# Patient Record
Sex: Female | Born: 1955 | Race: White | Hispanic: No | Marital: Single | Smoking: Former smoker
Health system: Southern US, Community
[De-identification: ages and names within clinical notes are randomized; demographics above are authoritative.]

## PROBLEM LIST (undated history)

## (undated) DIAGNOSIS — F0390 Unspecified dementia without behavioral disturbance: Secondary | ICD-10-CM

## (undated) DIAGNOSIS — F32A Depression, unspecified: Secondary | ICD-10-CM

## (undated) DIAGNOSIS — F209 Schizophrenia, unspecified: Secondary | ICD-10-CM

## (undated) HISTORY — PX: APPENDECTOMY: SHX54

## (undated) HISTORY — PX: TOTAL HIP ARTHROPLASTY: SHX124

## (undated) HISTORY — PX: HERNIA REPAIR: SHX51

## (undated) HISTORY — PX: TUBAL LIGATION: SHX77

## (undated) HISTORY — PX: SPLENECTOMY: SUR1306

---

## 2021-10-26 ENCOUNTER — Observation Stay: Payer: Medicare Other

## 2021-10-26 ENCOUNTER — Other Ambulatory Visit: Payer: Self-pay

## 2021-10-26 ENCOUNTER — Emergency Department: Payer: Medicare Other

## 2021-10-26 ENCOUNTER — Inpatient Hospital Stay
Admission: EM | Admit: 2021-10-26 | Discharge: 2021-11-01 | DRG: 689 | Disposition: A | Discharge status: Home Health | Payer: Medicare Other | Source: Skilled Nursing Facility | Attending: Internal Medicine | Admitting: Internal Medicine

## 2021-10-26 ENCOUNTER — Encounter: Payer: Self-pay | Admitting: Internal Medicine

## 2021-10-26 DIAGNOSIS — Z7901 Long term (current) use of anticoagulants: Secondary | ICD-10-CM

## 2021-10-26 DIAGNOSIS — F32A Depression, unspecified: Secondary | ICD-10-CM | POA: Diagnosis present

## 2021-10-26 DIAGNOSIS — E785 Hyperlipidemia, unspecified: Secondary | ICD-10-CM | POA: Diagnosis present

## 2021-10-26 DIAGNOSIS — Z79899 Other long term (current) drug therapy: Secondary | ICD-10-CM

## 2021-10-26 DIAGNOSIS — Z20822 Contact with and (suspected) exposure to covid-19: Secondary | ICD-10-CM | POA: Diagnosis present

## 2021-10-26 DIAGNOSIS — Z86718 Personal history of other venous thrombosis and embolism: Secondary | ICD-10-CM

## 2021-10-26 DIAGNOSIS — A040 Enteropathogenic Escherichia coli infection: Secondary | ICD-10-CM | POA: Diagnosis present

## 2021-10-26 DIAGNOSIS — R4182 Altered mental status, unspecified: Secondary | ICD-10-CM | POA: Diagnosis not present

## 2021-10-26 DIAGNOSIS — F0390 Unspecified dementia without behavioral disturbance: Secondary | ICD-10-CM | POA: Diagnosis present

## 2021-10-26 DIAGNOSIS — F0393 Unspecified dementia, unspecified severity, with mood disturbance: Secondary | ICD-10-CM | POA: Diagnosis present

## 2021-10-26 DIAGNOSIS — R600 Localized edema: Secondary | ICD-10-CM

## 2021-10-26 DIAGNOSIS — K219 Gastro-esophageal reflux disease without esophagitis: Secondary | ICD-10-CM | POA: Diagnosis present

## 2021-10-26 DIAGNOSIS — G9341 Metabolic encephalopathy: Secondary | ICD-10-CM | POA: Diagnosis present

## 2021-10-26 DIAGNOSIS — G2581 Restless legs syndrome: Secondary | ICD-10-CM | POA: Diagnosis present

## 2021-10-26 DIAGNOSIS — N3000 Acute cystitis without hematuria: Secondary | ICD-10-CM

## 2021-10-26 DIAGNOSIS — R531 Weakness: Secondary | ICD-10-CM | POA: Diagnosis present

## 2021-10-26 DIAGNOSIS — E782 Mixed hyperlipidemia: Secondary | ICD-10-CM

## 2021-10-26 DIAGNOSIS — N390 Urinary tract infection, site not specified: Secondary | ICD-10-CM | POA: Diagnosis not present

## 2021-10-26 DIAGNOSIS — F209 Schizophrenia, unspecified: Secondary | ICD-10-CM | POA: Diagnosis present

## 2021-10-26 DIAGNOSIS — Z9181 History of falling: Secondary | ICD-10-CM

## 2021-10-26 DIAGNOSIS — R8281 Pyuria: Secondary | ICD-10-CM | POA: Diagnosis present

## 2021-10-26 DIAGNOSIS — K224 Dyskinesia of esophagus: Secondary | ICD-10-CM | POA: Diagnosis present

## 2021-10-26 DIAGNOSIS — B951 Streptococcus, group B, as the cause of diseases classified elsewhere: Secondary | ICD-10-CM | POA: Diagnosis present

## 2021-10-26 DIAGNOSIS — R110 Nausea: Secondary | ICD-10-CM

## 2021-10-26 HISTORY — DX: Depression, unspecified: F32.A

## 2021-10-26 HISTORY — DX: Schizophrenia, unspecified: F20.9

## 2021-10-26 HISTORY — DX: Unspecified dementia, unspecified severity, without behavioral disturbance, psychotic disturbance, mood disturbance, and anxiety: F03.90

## 2021-10-26 LAB — URINE DRUG SCREEN, QUALITATIVE (ARMC ONLY)
Amphetamines, Ur Screen: NOT DETECTED
Barbiturates, Ur Screen: NOT DETECTED
Benzodiazepine, Ur Scrn: NOT DETECTED
Cannabinoid 50 Ng, Ur ~~LOC~~: NOT DETECTED
Cocaine Metabolite,Ur ~~LOC~~: NOT DETECTED
MDMA (Ecstasy)Ur Screen: NOT DETECTED
Methadone Scn, Ur: NOT DETECTED
Opiate, Ur Screen: NOT DETECTED
Phencyclidine (PCP) Ur S: POSITIVE — AB
Tricyclic, Ur Screen: POSITIVE — AB

## 2021-10-26 LAB — COMPREHENSIVE METABOLIC PANEL
ALT: 15 U/L (ref 0–44)
AST: 24 U/L (ref 15–41)
Albumin: 3.4 g/dL — ABNORMAL LOW (ref 3.5–5.0)
Alkaline Phosphatase: 90 U/L (ref 38–126)
Anion gap: 10 (ref 5–15)
BUN: 20 mg/dL (ref 8–23)
CO2: 24 mmol/L (ref 22–32)
Calcium: 10 mg/dL (ref 8.9–10.3)
Chloride: 108 mmol/L (ref 98–111)
Creatinine, Ser: 1.64 mg/dL — ABNORMAL HIGH (ref 0.44–1.00)
GFR, Estimated: 35 mL/min — ABNORMAL LOW (ref >60)
Glucose, Bld: 99 mg/dL (ref 70–99)
Potassium: 3.5 mmol/L (ref 3.5–5.1)
Sodium: 142 mmol/L (ref 135–145)
Total Bilirubin: 0.5 mg/dL (ref 0.3–1.2)
Total Protein: 6.7 g/dL (ref 6.5–8.1)

## 2021-10-26 LAB — CBC WITH DIFFERENTIAL/PLATELET
Abs Immature Granulocytes: 0.03 10*3/uL (ref 0.00–0.07)
Basophils Absolute: 0.1 10*3/uL (ref 0.0–0.1)
Basophils Relative: 1 %
Eosinophils Absolute: 0.1 10*3/uL (ref 0.0–0.5)
Eosinophils Relative: 1 %
HCT: 41.5 % (ref 36.0–46.0)
Hemoglobin: 12.6 g/dL (ref 12.0–15.0)
Immature Granulocytes: 0 %
Lymphocytes Relative: 30 %
Lymphs Abs: 3.5 10*3/uL (ref 0.7–4.0)
MCH: 31.3 pg (ref 26.0–34.0)
MCHC: 30.4 g/dL (ref 30.0–36.0)
MCV: 103 fL — ABNORMAL HIGH (ref 80.0–100.0)
Monocytes Absolute: 1.7 10*3/uL — ABNORMAL HIGH (ref 0.1–1.0)
Monocytes Relative: 15 %
Neutro Abs: 6.1 10*3/uL (ref 1.7–7.7)
Neutrophils Relative %: 53 %
Platelets: 391 10*3/uL (ref 150–400)
RBC: 4.03 MIL/uL (ref 3.87–5.11)
RDW: 13.6 % (ref 11.5–15.5)
WBC: 11.4 10*3/uL — ABNORMAL HIGH (ref 4.0–10.5)
nRBC: 0 % (ref 0.0–0.2)

## 2021-10-26 LAB — URINALYSIS, ROUTINE W REFLEX MICROSCOPIC
Bilirubin Urine: NEGATIVE
Glucose, UA: NEGATIVE mg/dL
Ketones, ur: NEGATIVE mg/dL
Nitrite: NEGATIVE
Protein, ur: 100 mg/dL — AB
Specific Gravity, Urine: 1.017 (ref 1.005–1.030)
WBC, UA: >50 WBC/hpf — ABNORMAL HIGH (ref 0–5)
pH: 5 (ref 5.0–8.0)

## 2021-10-26 LAB — TROPONIN I (HIGH SENSITIVITY)
Troponin I (High Sensitivity): 6 ng/L (ref <18)
Troponin I (High Sensitivity): 5 ng/L (ref <18)

## 2021-10-26 LAB — BLOOD GAS, VENOUS
Acid-base deficit: 1 mmol/L (ref 0.0–2.0)
Bicarbonate: 27.1 mmol/L (ref 20.0–28.0)
O2 Saturation: 25.6 %
Patient temperature: 37
pCO2, Ven: 59 mmHg (ref 44–60)
pH, Ven: 7.27 (ref 7.25–7.43)
pO2, Ven: <31 mmHg — CL (ref 32–45)

## 2021-10-26 LAB — SARS CORONAVIRUS 2 BY RT PCR: SARS Coronavirus 2 by RT PCR: NEGATIVE

## 2021-10-26 LAB — HIV ANTIBODY (ROUTINE TESTING W REFLEX): HIV Screen 4th Generation wRfx: NONREACTIVE

## 2021-10-26 LAB — ETHANOL: Alcohol, Ethyl (B): <10 mg/dL (ref <10)

## 2021-10-26 LAB — AMMONIA: Ammonia: 23 umol/L (ref 9–35)

## 2021-10-26 LAB — LACTIC ACID, PLASMA: Lactic Acid, Venous: 1.7 mmol/L (ref 0.5–1.9)

## 2021-10-26 LAB — CK: Total CK: 120 U/L (ref 38–234)

## 2021-10-26 MED ORDER — APIXABAN 5 MG PO TABS
5.0000 mg | ORAL_TABLET | Freq: Two times a day (BID) | ORAL | Status: DC
  Administered 2021-10-26 – 2021-10-29 (×6): 5 mg via ORAL
  Filled 2021-10-26 (×6): qty 1

## 2021-10-26 MED ORDER — QUETIAPINE FUMARATE 25 MG PO TABS
75.0000 mg | ORAL_TABLET | Freq: Every day | ORAL | Status: DC
  Administered 2021-10-26 – 2021-10-31 (×6): 75 mg via ORAL
  Filled 2021-10-26 (×6): qty 3

## 2021-10-26 MED ORDER — ACETAMINOPHEN 500 MG PO TABS: 1000.0000 mg | ORAL_TABLET | Freq: Four times a day (QID) | ORAL | Status: AC | PRN

## 2021-10-26 MED ORDER — VENLAFAXINE HCL 37.5 MG PO TABS
75.0000 mg | ORAL_TABLET | Freq: Two times a day (BID) | ORAL | Status: DC
  Administered 2021-10-26 – 2021-10-31 (×11): 75 mg via ORAL
  Filled 2021-10-26 (×12): qty 2

## 2021-10-26 MED ORDER — MELATONIN 5 MG PO TABS
2.5000 mg | ORAL_TABLET | Freq: Every day | ORAL | Status: DC
  Administered 2021-10-27 – 2021-10-31 (×5): 2.5 mg via ORAL
  Filled 2021-10-26 (×5): qty 1

## 2021-10-26 MED ORDER — VITAMIN B-12 1000 MCG PO TABS
1000.0000 ug | ORAL_TABLET | Freq: Every day | ORAL | Status: DC
  Administered 2021-10-27 – 2021-10-31 (×5): 1000 ug via ORAL
  Filled 2021-10-26 (×5): qty 1

## 2021-10-26 MED ORDER — BENZTROPINE MESYLATE 1 MG PO TABS
0.5000 mg | ORAL_TABLET | Freq: Two times a day (BID) | ORAL | Status: DC
  Administered 2021-10-26 – 2021-10-31 (×11): 0.5 mg via ORAL
  Filled 2021-10-26 (×11): qty 1

## 2021-10-26 MED ORDER — PANTOPRAZOLE SODIUM 40 MG PO TBEC
40.0000 mg | DELAYED_RELEASE_TABLET | Freq: Two times a day (BID) | ORAL | Status: DC
  Administered 2021-10-26 – 2021-10-31 (×11): 40 mg via ORAL
  Filled 2021-10-26 (×11): qty 1

## 2021-10-26 MED ORDER — ENOXAPARIN SODIUM 40 MG/0.4ML IJ SOSY: 40.0000 mg | PREFILLED_SYRINGE | INTRAMUSCULAR | Status: DC

## 2021-10-26 MED ORDER — MIRTAZAPINE 15 MG PO TABS
30.0000 mg | ORAL_TABLET | Freq: Every day | ORAL | Status: DC
  Administered 2021-10-26 – 2021-10-31 (×6): 30 mg via ORAL
  Filled 2021-10-26 (×6): qty 2

## 2021-10-26 MED ORDER — SODIUM CHLORIDE 0.9 % IV SOLN
1.0000 g | Freq: Once | INTRAVENOUS | Status: AC
  Administered 2021-10-26: 1 g via INTRAVENOUS
  Filled 2021-10-26: qty 10

## 2021-10-26 MED ORDER — ACETAMINOPHEN 650 MG RE SUPP: 650.0000 mg | Freq: Four times a day (QID) | RECTAL | Status: AC | PRN

## 2021-10-26 MED ORDER — ZOLPIDEM TARTRATE 5 MG PO TABS
5.0000 mg | ORAL_TABLET | Freq: Every evening | ORAL | Status: DC | PRN
  Administered 2021-10-31: 5 mg via ORAL
  Filled 2021-10-26: qty 1

## 2021-10-26 MED ORDER — ONDANSETRON HCL 4 MG/2ML IJ SOLN
4.0000 mg | Freq: Four times a day (QID) | INTRAMUSCULAR | Status: AC | PRN
  Administered 2021-10-27 – 2021-10-29 (×3): 4 mg via INTRAVENOUS
  Filled 2021-10-26 (×3): qty 2

## 2021-10-26 MED ORDER — ROPINIROLE HCL 1 MG PO TABS
0.5000 mg | ORAL_TABLET | Freq: Every day | ORAL | Status: DC
  Administered 2021-10-26 – 2021-10-31 (×6): 0.5 mg via ORAL
  Filled 2021-10-26 (×6): qty 1

## 2021-10-26 MED ORDER — ROSUVASTATIN CALCIUM 10 MG PO TABS
10.0000 mg | ORAL_TABLET | Freq: Every day | ORAL | Status: DC
Start: 2021-10-26 — End: 2021-11-01
  Administered 2021-10-26 – 2021-10-31 (×6): 10 mg via ORAL
  Filled 2021-10-26 (×6): qty 1

## 2021-10-26 MED ORDER — SODIUM CHLORIDE 0.9 % IV SOLN
1.0000 g | INTRAVENOUS | Status: DC
  Administered 2021-10-27 – 2021-10-28 (×2): 1 g via INTRAVENOUS
  Filled 2021-10-26: qty 1
  Filled 2021-10-26: qty 10
  Filled 2021-10-26: qty 1

## 2021-10-26 MED ORDER — ONDANSETRON HCL 4 MG PO TABS
4.0000 mg | ORAL_TABLET | Freq: Four times a day (QID) | ORAL | Status: AC | PRN
  Administered 2021-10-28 – 2021-10-29 (×2): 4 mg via ORAL
  Filled 2021-10-26 (×2): qty 1

## 2021-10-26 NOTE — ED Provider Notes (Signed)
[facility] Provider Note    Event Date/Time   First MD Initiated Contact with Patient 10/26/21 1126     (approximate)   History   Altered Mental Status (Patient BIB for AMS; Patient somnolent but oriented, falls asleep during triage; Denies pain; CBG 127; Denies any falls or head injuries)   HPI  Kristin Watts is a 66 y.o. female  with dementia, schizophrenia, dvt on eliquis who comes in for AMS from assisted living. Unclear what is her baseline. Unclear her last normal.  Patient has reported some falls to me.  Aox2.  Unclear if there is any new medication changes  12:13 PM  Talked to nursing staff noted to not be normal since Saturday night with hallucinations and see people in her room-Seems different then her normal. Sunday seemed a bit better but then again had more hallucinations at night. Last night she didn't want to get into bed due to people in her bed. She has had increased urination as well. Strong odor was noted as well. She started having this morning the drifting off and falling asleep and wanted her to be evaluate. Unknown if any medications changes. Last fall was a few weeks ago. Normally ambulatory.    Physical Exam   Triage Vital Signs: Blood pressure (!) 145/79, pulse 71, temperature 98 F (36.7 C), temperature source Oral, resp. rate 19, weight 78.1 kg, SpO2 98 %.   Most recent vital signs: Vitals:   10/26/21 1133  BP: (!) 145/79  Pulse: 71  Resp: 19  Temp: 98 F (36.7 C)  SpO2: 98%     General: Awake, no distress.  CV:  Good peripheral perfusion.  Resp:  Normal effort.  Abd:  No distention.  Other:  Patient able to lift both legs up off the bed.  Able to do grip strength bilaterally.  Patient is frequently falling asleep but pupils are reactive she is oriented x2 just continues to drift off to sleep   ED Results / Procedures / Treatments   Labs (all labs ordered are listed, but only abnormal results are displayed) Labs  Reviewed  SARS CORONAVIRUS 2 BY RT PCR  COMPREHENSIVE METABOLIC PANEL  CBC WITH DIFFERENTIAL/PLATELET  LACTIC ACID, PLASMA  AMMONIA  BLOOD GAS, VENOUS  CK  URINALYSIS, ROUTINE W REFLEX MICROSCOPIC  URINE DRUG SCREEN, QUALITATIVE ([facility] ONLY)  ETHANOL  TROPONIN I (HIGH SENSITIVITY)     EKG  My interpretation of EKG:  Sinus rate of 71 without any ST elevation or T wave inversions, normal intervals except for type I AV block  RADIOLOGY I have reviewed the CT head personally and interpreted I do not see evidence of intracranial hemorrhage   PROCEDURES:  Critical Care performed: No  .1-3 Lead EKG Interpretation  Performed by: Concha Se, MD Authorized by: Concha Se, MD     Interpretation: normal     ECG rate:  71   ECG rate assessment: normal     Rhythm: sinus rhythm     Ectopy: none     Conduction: normal      MEDICATIONS ORDERED IN ED: Medications  cefTRIAXone (ROCEPHIN) 1 g in sodium chloride 0.9 % 100 mL IVPB (has no administration in time range)     IMPRESSION / MDM / ASSESSMENT AND PLAN / ED COURSE  I reviewed the triage vital signs and the nursing notes.   Patient's presentation is most consistent with acute presentation with potential threat to life or bodily function.  Differential includes hypercapnia, to cranial hemorrhage, cervical fracture, medications, UTI  CBC slightly elevated white count but patient does not meet sepsis criteria so blood cultures and lactate were not ordered.  Creatinine was elevated at 1.6 unclear baseline.  Notes of ammonia elevation.  Patient does have what is concerning for UTI.  CO2 normal  On repeat evaluation patient is still pretty sleepy at bedside however she is able to wake up denies any back pain or abdominal pain.  I rechecked her temperature and she remains afebrile.  Given patient is not the best historian I will add on a CT renal just to make sure there is no infected kidney stones that would require  procedure.  Given patient's altered mental status with UTI and hallucinations will discuss with hospital team for admission  The patient is on the cardiac monitor to evaluate for evidence of arrhythmia and/or significant heart rate changes.      FINAL CLINICAL IMPRESSION(S) / ED DIAGNOSES   Final diagnoses:  Altered mental status, unspecified altered mental status type  Acute cystitis without hematuria     Rx / DC Orders   ED Discharge Orders     None        Note:  This document was prepared using Dragon voice recognition software and may include unintentional dictation errors.   Concha Se, MD 10/26/21 [phone]

## 2021-10-26 NOTE — Assessment & Plan Note (Signed)
-   Ropinirole 0.5 mg nightly resumed 

## 2021-10-26 NOTE — Assessment & Plan Note (Signed)
Continue Crestor and Zetia 

## 2021-10-26 NOTE — Assessment & Plan Note (Addendum)
-   Presumptive diagnosis is secondary to UTI - Continue ceftriaxone 1 g daily, 4 more additional doses ordered to complete 5 day course - Urine culture is in process - Admit to telemetry medical, observation

## 2021-10-26 NOTE — Assessment & Plan Note (Addendum)
-   Benztropine 0.5 mg p.o. twice daily

## 2021-10-26 NOTE — Assessment & Plan Note (Signed)
-  Continue home Tikosyn -Holding Eliquis as patient is currently on heparin infusion, most likely with starting after cardiac catheterization 

## 2021-10-26 NOTE — Plan of Care (Signed)

## 2021-10-26 NOTE — Assessment & Plan Note (Signed)
-   Quetiapine 75 mg nightly, venlafaxine 75 mg twice daily

## 2021-10-26 NOTE — Assessment & Plan Note (Signed)
-   Mirtazapine 30 mg nightly, venlafaxine 75 mg twice daily resumed

## 2021-10-26 NOTE — H&P (Signed)
History and Physical   [name] [id number] DOB: 1956/03/05 DOA: 10/26/2021  PCP: Pcp, No  Patient coming from: [place]  I have personally briefly reviewed patient's old medical records in [place].  Chief Concern: Altered mental status  HPI: Ms. Kristin Watts is a 66 year old female with history of GERD, restless leg syndrome, hyperlipidemia, insomnia, depression, schizophrenia, dementia, history of DVT on Eliquis, who presented to the emergency department for chief concerns of altered mental status.  Patient is from [facility].  And was brought into the ED from EMS.  Initial vitals in the emergency department showed temperature of 98, respiration rate of 19, heart rate 71, blood pressure 134/75, SPO2 97% on room air.  Serum sodium was 142, potassium 3.5, chloride 108, bicarb 24, BUN of 20, serum creatinine 1.64, GFR 35, nonfasting glucose 99, WBC 11.4, hemoglobin 12.6, platelets of 391.  Ammonia was 23.  CK was 120.  High sensitive troponin was 16.UA was positive for large leukocytes.  CT of the head without contrast was ordered by EDP and read as no acute intracranial findings or skull fracture.  Normal alignment of the cervical spine without acute fracture, disc protrusions or significant spinal or foraminal stenosis  ED treatment ceftriaxone 1 g IV.  At bedside patient is sleeping.  She is easily arousable with loud verbal stimuli.  When she wakes up I am able to ask her what her name is and she responds Wells Guiles.  She knows her age.  She knows she is in the hospital.  She was unable to tell me the current calendar year.  She does not appear to be in acute distress.  She denies being in pain.  She then falls back to sleep.  Social history: She is currently from [facility]  ROS: Unable to complete due to patient with dementia  ED Course: Discussed with emergency medicine provider, patient requiring hospitalization for chief concerns of  worsening/altered mental status with baseline dementia and schizophrenia.  Assessment/Plan  Principal Problem:   Altered mental status Active Problems:   Dementia without behavioral disturbance (HCC)   Schizophrenia (HCC)   History of DVT (deep vein thrombosis)   On continuous oral anticoagulation   Hyperlipidemia   Depression   Restless leg syndrome   Assessment and Plan:  * Altered mental status - Presumptive diagnosis is secondary to UTI - Continue ceftriaxone 1 g daily, 4 more additional doses ordered to complete 5 day course - Urine culture is in process - Admit to telemetry medical, observation  Restless leg syndrome - Ropinirole 0.5 mg nightly resumed  Depression - Mirtazapine 30 mg nightly, venlafaxine 75 mg twice daily resumed  Hyperlipidemia - Rosuvastatin 10 mg nightly resumed  On continuous oral anticoagulation - Resumed apixaban 5 mg p.o. twice daily  Schizophrenia (HCC) - Quetiapine 75 mg nightly, venlafaxine 75 mg twice daily  Dementia without behavioral disturbance (HCC) - Benztropine 0.5 mg p.o. twice daily  Chart reviewed.  Patient is new to the hospital system.  DVT prophylaxis: Eliquis Code Status: Full code Diet: Heart healthy Family Communication: No Disposition Plan: Pending clinical course Consults called: None at this time Admission status: Telemetry medical, observation  Past Medical History:  Diagnosis Date   Dementia ([place])    Depression    Schizophrenia ([place])    History reviewed. No pertinent surgical history.  Social History:  reports that she has never smoked. She has never used smokeless tobacco. She reports that she does  not drink alcohol and does not use drugs.  Not on File Family History  Family history unknown: Yes   Family history: Family history reviewed and not pertinent  Prior to Admission medications   Medication Sig Start Date End Date Taking? Authorizing Provider  benztropine (COGENTIN) 0.5 MG tablet Take  0.5 mg by mouth 2 (two) times daily. 10/08/21  Yes [provider]  cholecalciferol (VITAMIN D) 25 MCG (1000 UNIT) tablet Take 1,000 Units by mouth daily. 10/08/21  Yes [provider]  ELIQUIS 5 MG TABS tablet Take 5 mg by mouth 2 (two) times daily. 10/08/21  Yes [provider]  gabapentin (NEURONTIN) 400 MG capsule Take 400 mg by mouth 4 (four) times daily. 10/08/21  Yes [provider]  GNP MELATONIN 3 MG TABS tablet Take 3 mg by mouth at bedtime. 10/08/21  Yes [provider]  hydrOXYzine (ATARAX) 50 MG tablet Take 50 mg by mouth 4 (four) times daily. 10/08/21  Yes [provider]  mirtazapine (REMERON) 30 MG tablet Take 30 mg by mouth at bedtime. 10/08/21  Yes [provider]  Multiple Vitamin (TAB-A-VITE) TABS Take 1 tablet by mouth daily. 10/08/21  Yes [provider]  pantoprazole (PROTONIX) 40 MG tablet Take 40 mg by mouth 2 (two) times daily. 10/08/21  Yes [provider]  QUEtiapine (SEROQUEL) 25 MG tablet Take 75 mg by mouth at bedtime. 10/08/21  Yes [provider]  rOPINIRole (REQUIP) 0.5 MG tablet Take 0.5 mg by mouth at bedtime. 10/08/21  Yes [provider]  rosuvastatin (CRESTOR) 10 MG tablet Take 10 mg by mouth at bedtime. 10/08/21  Yes [provider]  traMADol (ULTRAM) 50 MG tablet Take 100 mg by mouth 3 (three) times daily. 10/11/21  Yes [provider]  venlafaxine (EFFEXOR) 75 MG tablet Take 75 mg by mouth 2 (two) times daily. 10/08/21  Yes [provider]  vitamin B-12 (CYANOCOBALAMIN) 1000 MCG tablet Take 1,000 mcg by mouth daily. 10/08/21  Yes [provider]  zolpidem (AMBIEN) 5 MG tablet Take 5 mg by mouth at bedtime as needed. 10/05/21  Yes [provider]  ketoconazole (NIZORAL) 2 % cream Apply 1 Application topically daily. 09/27/21   [provider]  promethazine (PHENERGAN) 25 MG tablet Take 25 mg by mouth every 8 (eight) hours as needed. 09/17/21    [provider]  traZODone (DESYREL) 100 MG tablet Take 100 mg by mouth at bedtime. Patient not taking: Reported on 10/26/2021 09/14/21   [provider]   Physical Exam: Vitals:   10/26/21 1545 10/26/21 1600 10/26/21 1616 10/26/21 1808  BP: 121/60 121/73  (!) 141/88  Pulse: 61 66  74  Resp:    19  Temp:   97.8 F (36.6 C) 97.7 F (36.5 C)  TempSrc:   Oral   SpO2: 98% 98%  96%  Weight:      Height:       Constitutional: appears lacks self-care, appears older than chronological age,, NAD, calm, comfortable Eyes: PERRL, lids and conjunctivae normal ENMT: Mucous membranes are moist. Posterior pharynx clear of any exudate or lesions. Age-appropriate dentition. Hearing appropriate Neck: normal, supple, no masses, no thyromegaly Respiratory: clear to auscultation bilaterally, no wheezing, no crackles. Normal respiratory effort. No accessory muscle use.  Cardiovascular: Regular rate and rhythm, no murmurs / rubs / gallops. No extremity edema. 2+ pedal pulses. No carotid bruits.  Abdomen: Obese abdomen, no tenderness, no masses palpated, no hepatosplenomegaly. Bowel sounds positive.  Musculoskeletal:  no clubbing / cyanosis. No joint deformity upper and lower extremities. Good ROM, no contractures, no atrophy. Normal muscle tone.  Skin: no rashes, lesions, ulcers. No induration Neurologic: Sensation intact. Strength 5/5 in all 4.  Psychiatric: Appears to lack judgment and insight. Alert and oriented x self, age, location. Normal mood.   EKG: independently reviewed, showing sinus rhythm 71, QTc 449  Chest x-ray on Admission: I personally reviewed and I agree with radiologist reading as below.  CT Renal Stone Study  Result Date: 10/26/2021 CLINICAL DATA:  Flank pain.  Rule out kidney stone. EXAM: CT ABDOMEN AND PELVIS WITHOUT CONTRAST TECHNIQUE: Multidetector CT imaging of the abdomen and pelvis was performed following the standard protocol without IV contrast. RADIATION  DOSE REDUCTION: This exam was performed according to the departmental dose-optimization program which includes automated exposure control, adjustment of the mA and/or kV according to patient size and/or use of iterative reconstruction technique. COMPARISON:  None Available. FINDINGS: Lower chest: Scarring and or atelectasis identified within both lung bases. No pleural effusions identified. Hepatobiliary: No focal liver abnormality is seen. No gallstones, gallbladder wall thickening, or biliary dilatation. Pancreas: Unremarkable. No pancreatic ductal dilatation or surrounding inflammatory changes. Spleen: Normal in size without focal abnormality. Adrenals/Urinary Tract: Normal adrenal glands. No kidney stones identified bilaterally. No mass or hydronephrosis. No hydroureter or ureteral calculi bilaterally. Urinary bladder is unremarkable. Stomach/Bowel: Stomach appears normal. The appendix is not visualized and may be surgically absent. Small bowel loops appear unremarkable. The colon appears relatively featureless with loss of many of the normal haustral folds. Equivocal areas of mild wall thickening noted involving the proximal sigmoid colon. No significant pericolonic fat stranding. No signs of free fluid or perforation. Vascular/Lymphatic: Aortic atherosclerosis. Infrarenal abdominal aortic aneurysm measures 3.1 cm, image 50/2. There are vascular stents identified within the common iliac veins bilaterally. Small retroperitoneal lymph nodes are identified. No abdominopelvic adenopathy. Reproductive: Uterus and bilateral adnexa are unremarkable. Other: No abdominal wall hernia or abnormality. No abdominopelvic ascites. Musculoskeletal: Status post right hip arthroplasty. No acute or suspicious osseous findings identified. IMPRESSION: 1. No acute findings within the abdomen or pelvis. 2. No nephrolithiasis or hydronephrosis. 3. The colon appears relatively featureless with loss of many of the normal haustral folds.  Equivocal areas of mild wall thickening noted involving the proximal sigmoid colon. No significant pericolonic fat stranding. Findings are nonspecific but may reflect sequelae of chronic colitis. 4. 3.1 cm infrarenal abdominal aortic aneurysm. Recommend follow-up every 3 years. Reference: J Am Coll Radiol 2013;10:789-794. 5. Aortic Atherosclerosis (ICD10-I70.0). Electronically Signed   By: Signa Kell M.D.   On: 10/26/2021 14:17   DG Chest Port 1 View  Result Date: 10/26/2021 CLINICAL DATA:  Altered mental status. EXAM: PORTABLE CHEST 1 VIEW COMPARISON:  None Available. FINDINGS: The patient is rotated to the left, limiting evaluation. Heart size normal given technique and rotation. Low lung volumes are present, causing crowding of the pulmonary vasculature. Mild bibasilar atelectasis. No pneumothorax or pleural effusion. No acute osseous abnormality. IMPRESSION: 1. Low lung volumes and bibasilar atelectasis. Electronically Signed   By: Obie Dredge M.D.   On: 10/26/2021 13:23   CT HEAD WO CONTRAST ( )  Result Date: 10/26/2021 CLINICAL DATA:  Larey Seat.  Hit head. EXAM: CT HEAD WITHOUT CONTRAST CT CERVICAL SPINE WITHOUT CONTRAST TECHNIQUE: Multidetector CT imaging of the head and cervical spine was performed following the standard protocol without intravenous contrast. Multiplanar CT image reconstructions of the cervical spine were also generated. RADIATION DOSE REDUCTION: This exam was  performed according to the departmental dose-optimization program which includes automated exposure control, adjustment of the mA and/or kV according to patient size and/or use of iterative reconstruction technique. COMPARISON:  None Available. FINDINGS: CT HEAD FINDINGS Brain: No evidence of acute infarction, hemorrhage, hydrocephalus, extra-axial collection or mass lesion/mass effect. Vascular: Scattered vascular calcifications. No aneurysm or hyperdense vessels. Skull: No skull fracture or bone lesions. Sinuses/Orbits:  The paranasal sinuses and mastoid air cells are clear. The globes are intact. Other: No scalp lesions or scalp hematoma. CT CERVICAL SPINE FINDINGS Alignment: Normal Skull base and vertebrae: No acute fracture. No primary bone lesion or focal pathologic process. Soft tissues and spinal canal: No prevertebral fluid or swelling. No visible canal hematoma. Disc levels: The spinal canal is fairly generous. No large disc protrusions, significant spinal or foraminal stenosis. Upper chest: The lung apices are grossly clear. Other: No lower neck mass, adenopathy or hematoma. 12.5 mm left thyroid nodule with scattered calcifications. Not clinically significant; no follow-up imaging recommended (Ref: J Am Coll Radiol. 2015 Feb;12(2): 143-50). IMPRESSION: 1. No acute intracranial findings or skull fracture. 2. Normal alignment of the cervical spine without acute fracture, disc protrusions or significant spinal or foraminal stenosis. Electronically Signed   By: Rudie Meyer M.D.   On: 10/26/2021 12:53   CT Cervical Spine Wo Contrast  Result Date: 10/26/2021 CLINICAL DATA:  Larey Seat.  Hit head. EXAM: CT HEAD WITHOUT CONTRAST CT CERVICAL SPINE WITHOUT CONTRAST TECHNIQUE: Multidetector CT imaging of the head and cervical spine was performed following the standard protocol without intravenous contrast. Multiplanar CT image reconstructions of the cervical spine were also generated. RADIATION DOSE REDUCTION: This exam was performed according to the departmental dose-optimization program which includes automated exposure control, adjustment of the mA and/or kV according to patient size and/or use of iterative reconstruction technique. COMPARISON:  None Available. FINDINGS: CT HEAD FINDINGS Brain: No evidence of acute infarction, hemorrhage, hydrocephalus, extra-axial collection or mass lesion/mass effect. Vascular: Scattered vascular calcifications. No aneurysm or hyperdense vessels. Skull: No skull fracture or bone lesions.  Sinuses/Orbits: The paranasal sinuses and mastoid air cells are clear. The globes are intact. Other: No scalp lesions or scalp hematoma. CT CERVICAL SPINE FINDINGS Alignment: Normal Skull base and vertebrae: No acute fracture. No primary bone lesion or focal pathologic process. Soft tissues and spinal canal: No prevertebral fluid or swelling. No visible canal hematoma. Disc levels: The spinal canal is fairly generous. No large disc protrusions, significant spinal or foraminal stenosis. Upper chest: The lung apices are grossly clear. Other: No lower neck mass, adenopathy or hematoma. 12.5 mm left thyroid nodule with scattered calcifications. Not clinically significant; no follow-up imaging recommended (Ref: J Am Coll Radiol. 2015 Feb;12(2): 143-50). IMPRESSION: 1. No acute intracranial findings or skull fracture. 2. Normal alignment of the cervical spine without acute fracture, disc protrusions or significant spinal or foraminal stenosis. Electronically Signed   By: Rudie Meyer M.D.   On: 10/26/2021 12:53    Labs on Admission: I have personally reviewed following labs  CBC: Recent Labs  Lab 10/26/21 1136  WBC 11.4*  NEUTROABS 6.1  HGB 12.6  HCT 41.5  MCV 103.0*  PLT 391   Basic Metabolic Panel: Recent Labs  Lab 10/26/21 1211  NA 142  K 3.5  CL 108  CO2 24  GLUCOSE 99  BUN 20  CREATININE 1.64*  CALCIUM 10.0   GFR: Estimated Creatinine Clearance: 36.1 mL/min (A) (by C-G formula based on SCr of 1.64 mg/dL (H)).  Liver Function Tests:  Recent Labs  Lab 10/26/21 1211  AST 24  ALT 15  ALKPHOS 90  BILITOT 0.5  PROT 6.7  ALBUMIN 3.4*    Recent Labs  Lab 10/26/21 1209  AMMONIA 23   Coagulation Profile: No results for input(s): "INR", "PROTIME" in the last 168 hours.  Cardiac Enzymes: Recent Labs  Lab 10/26/21 1211  CKTOTAL 120   Urine analysis:    Component Value Date/Time   COLORURINE YELLOW (A) 10/26/2021 1209   APPEARANCEUR TURBID (A) 10/26/2021 1209   LABSPEC  1.017 10/26/2021 1209   PHURINE 5.0 10/26/2021 1209   GLUCOSEU NEGATIVE 10/26/2021 1209   HGBUR LARGE (A) 10/26/2021 1209   BILIRUBINUR NEGATIVE 10/26/2021 [place] 10/26/2021 1209   PROTEINUR 100 (A) 10/26/2021 1209   NITRITE NEGATIVE 10/26/2021 1209   LEUKOCYTESUR LARGE (A) 10/26/2021 1209   Dr. Tobie Poet [facility]  If 7PM-7AM, please contact overnight-coverage provider If 7AM-7PM, please contact day coverage provider [email or web address]  10/26/2021, 7:13 PM

## 2021-10-26 NOTE — Hospital Course (Signed)
Ms. Kristin Watts is a 67 year old female with history of GERD, restless leg syndrome, hyperlipidemia, insomnia, depression, schizophrenia, dementia, history of DVT on Eliquis, who presented to the emergency department for chief concerns of altered mental status. Patient currently living in a group home, she is complaining of unsteady gait chronically and dysphagia. She was found to have urinary tract infection, started on Rocephin.

## 2021-10-26 NOTE — Assessment & Plan Note (Addendum)
UTI - present on admission - With reported altered mental status - I have continue ceftriaxone 1 g IV daily - Urine culture in process

## 2021-10-27 ENCOUNTER — Inpatient Hospital Stay: Payer: Medicare Other

## 2021-10-27 DIAGNOSIS — N390 Urinary tract infection, site not specified: Secondary | ICD-10-CM | POA: Diagnosis present

## 2021-10-27 DIAGNOSIS — R4182 Altered mental status, unspecified: Secondary | ICD-10-CM | POA: Diagnosis present

## 2021-10-27 DIAGNOSIS — F209 Schizophrenia, unspecified: Secondary | ICD-10-CM | POA: Diagnosis present

## 2021-10-27 DIAGNOSIS — G9341 Metabolic encephalopathy: Secondary | ICD-10-CM | POA: Diagnosis present

## 2021-10-27 DIAGNOSIS — K219 Gastro-esophageal reflux disease without esophagitis: Secondary | ICD-10-CM | POA: Diagnosis present

## 2021-10-27 DIAGNOSIS — K224 Dyskinesia of esophagus: Secondary | ICD-10-CM | POA: Diagnosis present

## 2021-10-27 DIAGNOSIS — R110 Nausea: Secondary | ICD-10-CM | POA: Diagnosis not present

## 2021-10-27 DIAGNOSIS — E785 Hyperlipidemia, unspecified: Secondary | ICD-10-CM | POA: Diagnosis present

## 2021-10-27 DIAGNOSIS — R531 Weakness: Secondary | ICD-10-CM | POA: Diagnosis present

## 2021-10-27 DIAGNOSIS — Z86718 Personal history of other venous thrombosis and embolism: Secondary | ICD-10-CM | POA: Diagnosis not present

## 2021-10-27 DIAGNOSIS — Z7901 Long term (current) use of anticoagulants: Secondary | ICD-10-CM | POA: Diagnosis not present

## 2021-10-27 DIAGNOSIS — N3000 Acute cystitis without hematuria: Secondary | ICD-10-CM | POA: Diagnosis not present

## 2021-10-27 DIAGNOSIS — A040 Enteropathogenic Escherichia coli infection: Secondary | ICD-10-CM | POA: Diagnosis present

## 2021-10-27 DIAGNOSIS — Z20822 Contact with and (suspected) exposure to covid-19: Secondary | ICD-10-CM | POA: Diagnosis present

## 2021-10-27 DIAGNOSIS — Z79899 Other long term (current) drug therapy: Secondary | ICD-10-CM | POA: Diagnosis not present

## 2021-10-27 DIAGNOSIS — B951 Streptococcus, group B, as the cause of diseases classified elsewhere: Secondary | ICD-10-CM | POA: Diagnosis present

## 2021-10-27 DIAGNOSIS — Z9181 History of falling: Secondary | ICD-10-CM | POA: Diagnosis not present

## 2021-10-27 DIAGNOSIS — G2581 Restless legs syndrome: Secondary | ICD-10-CM | POA: Diagnosis present

## 2021-10-27 DIAGNOSIS — F0393 Unspecified dementia, unspecified severity, with mood disturbance: Secondary | ICD-10-CM | POA: Diagnosis present

## 2021-10-27 LAB — URINE CULTURE: Culture: >=100000 — AB

## 2021-10-27 NOTE — Evaluation (Signed)
Occupational Therapy Evaluation Patient Details Name: Kristin Watts MRN: [id number] DOB: 1955-08-12 Today's Date: 10/27/2021   History of Present Illness Ms. Kristin Watts is a 66 year old female with history of GERD, restless leg syndrome, hyperlipidemia, insomnia, depression, schizophrenia, dementia, history of DVT on Eliquis, who presented to the emergency department for chief concerns of altered mental status.  Patient currently living in a group home, she is complaining of unsteady gait chronically and dysphagia.  She was found to have urinary tract infection, started on Rocephin.   Clinical Impression   Pt seen for OT evaluation this date.  Pt presents with difficulty performing ADLs and functional mobility d/t weakness and instability.  Pt comes from a group home.  No caregiver present to confirm PLOF, but pt indicated that she managed her own basic ADLs while meals were provided, and 24 hour supv was always in place.  Pt stated that she was recently close to the point of not needing her walker or wc, but was unable to specify time lines for how long she'd used this equipment.  Pt reporting her discouragement with hospital admission and some recent falls, setting her back from her mobility progression.  Pt managed bed mobility today with supv only, EOB sitting fair, but sit to stand was impulsive and unsteady.  Pt declined to attempt any steps forward toward bathroom as she impulsively sat back down upon standing each time, noting fear of falling, and later reporting dizziness.  Eval ended after sit to stand attempts as pt had to leave for a test.  Pt will benefit from additional skilled OT in the acute setting to maximize safety and indep with basic ADLs and functional transfers.  Recommend HH upon hospital d/c as pt has 24 hr supv and presuming walker/wc for mobility.      Recommendations for follow up therapy are one component of a multi-disciplinary discharge planning process, led by the  attending physician.  Recommendations may be updated based on patient status, additional functional criteria and insurance authorization.   Follow Up Recommendations  Home health OT    Assistance Recommended at Discharge Frequent or constant Supervision/Assistance  Patient can return home with the following A lot of help with walking and/or transfers;A lot of help with bathing/dressing/bathroom;Assistance with cooking/housework;Direct supervision/assist for medications management;Help with stairs or ramp for entrance    Functional Status Assessment  Patient has had a recent decline in their functional status and demonstrates the ability to make significant improvements in function in a reasonable and predictable amount of time.  Equipment Recommendations  BSC/3in1    Recommendations for Other Services       Precautions / Restrictions Precautions Precautions: Fall Restrictions Weight Bearing Restrictions: No      Mobility Bed Mobility Overal bed mobility: Needs Assistance Bed Mobility: Supine to Sit, Sit to Supine     Supine to sit: Supervision Sit to supine: Supervision   General bed mobility comments: did not require use of bed rails Patient Response: Cooperative (anxious with standing)  Transfers Overall transfer level: Needs assistance Equipment used: Rolling walker (2 wheels) Transfers: Sit to/from Stand Sit to Stand: Min assist           General transfer comment: Pt requied cues for hand placement with sit<>stand; impulsivity noted on 2 attempts and pt was fearful of falling and sat herself back down.      Balance Overall balance assessment: Needs assistance Sitting-balance support: Single extremity supported, Feet unsupported Sitting balance-Leahy Scale: Fair Sitting balance - Comments: Pt  was able to tolerate min perturbation A/P and laterally R/L from therapist while sitting EOB   Standing balance support: Bilateral upper extremity supported, Reliant on  assistive device for balance Standing balance-Leahy Scale: Poor Standing balance comment: Pt prompted to step forward to amb towards bathroom but pt was too fearful and sat herself back down on EOB.  Pt later reported that when she stood up she felt dizzy.                           ADL either performed or assessed with clinical judgement   ADL Overall ADL's : Needs assistance/impaired                     Lower Body Dressing: Minimal assistance;Sit to/from stand Lower Body Dressing Details (indicate cue type and reason): Would need assist to hike pants in standing d/t decreased balance.  Pt can touch feet sitting EOB and cross legs to reach socks without LOB.  Standing balance impaired. Toilet Transfer: Minimal assistance;BSC/3in1;Rolling walker (2 wheels) Toilet Transfer Details (indicate cue type and reason): Based on physical performance with sit to stand and pt fearful to step forward and report from RN that pt used BSC with assist ~1 hour ago.         Functional mobility during ADLs: Minimal assistance;Rolling walker (2 wheels) General ADL Comments: Performed sit to stand x2 trials from EOB.  Pt fearful to step forward and sat herself back down quickly.  RN reported pt used BSC prior to OT arrival with assist.     Vision Patient Visual Report: No change from baseline                  Pertinent Vitals/Pain Pain Assessment Pain Assessment: No/denies pain     Hand Dominance     Extremity/Trunk Assessment Upper Extremity Assessment Upper Extremity Assessment: Generalized weakness   Lower Extremity Assessment Lower Extremity Assessment: Generalized weakness   Cervical / Trunk Assessment Cervical / Trunk Assessment: Kyphotic   Communication Communication Communication: No difficulties   Cognition Arousal/Alertness: Awake/alert Behavior During Therapy: WFL for tasks assessed/performed Overall Cognitive Status: Within Functional Limits for tasks  assessed                                 General Comments: Inconsistent historian.  Pt seemed to confuse days, weeks, and years when discussing PLOF.  Pt oriented to self and being in the hospital.     General Comments  Sp02 on 2L at 95%, resting.  Room air 90% with activity.  02 donned again upon OT exit.  Pt getting ready to leave for test.    Exercises Other Exercises Other Exercises: Role of OT, goals, poc   Shoulder Instructions      Home Living Family/patient expects to be discharged to:: Group home                                 Additional Comments: pt reports she used a walk in shower      Prior Functioning/Environment Prior Level of Function : History of Falls (last six months);Patient poor historian/Family not available             Mobility Comments: Pt seems to be an inconsistent historian but reports that she was recently to the point of less  need for her walker and wc. ADLs Comments: Pt reports that she was able to shower, dress, and use the bathroom without caregiver assist, but no caregiver present to confirm accuracy.  Pt states that meals were provided at group home.        OT Problem List: Decreased strength;Decreased activity tolerance;Decreased cognition;Impaired balance (sitting and/or standing);Decreased safety awareness      OT Treatment/Interventions: Self-care/ADL training;Balance training;Therapeutic exercise;Therapeutic activities;Patient/family education    OT Goals(Current goals can be found in the care plan section) Acute Rehab OT Goals Patient Stated Goal: Go home.  Avoid falling. OT Goal Formulation: With patient Time For Goal Achievement: 11/10/21 Potential to Achieve Goals: Good ADL Goals Pt Will Perform Grooming: with min guard assist;standing Pt Will Perform Lower Body Dressing: with min guard assist;sit to/from stand Pt Will Transfer to Toilet: with min assist;ambulating;regular height toilet;grab bars   OT Frequency: Min 2X/week                  AM-PAC OT "6 Clicks" Daily Activity     Outcome Measure Help from another person eating meals?: None Help from another person taking care of personal grooming?: A Little Help from another person toileting, which includes using toliet, bedpan, or urinal?: A Lot Help from another person bathing (including washing, rinsing, drying)?: A Little Help from another person to put on and taking off regular upper body clothing?: A Little Help from another person to put on and taking off regular lower body clothing?: A Little 6 Click Score: 18   End of Session Equipment Utilized During Treatment: Gait belt;Rolling walker (2 wheels) Nurse Communication: Mobility status  Activity Tolerance: Patient tolerated treatment well Patient left: in bed;with call bell/phone within reach;with bed alarm set  OT Visit Diagnosis: Unsteadiness on feet (R26.81);History of falling (Z91.81);Muscle weakness (generalized) (M62.81)                Time: 2440-1027 OT Time Calculation (min): 13 min Charges:  OT General Charges $OT Visit: 1 Visit OT Evaluation $OT Eval Moderate Complexity: 1 Mod  Danelle Earthly, MS, OTR/L   Otis Dials 10/27/2021, 2:44 PM

## 2021-10-27 NOTE — Progress Notes (Signed)
  Progress Note   Patient: Kristin Watts [id number]:[id number] DOB: 09-Dec-1955 DOA: 10/26/2021     0 DOS: the patient was seen and examined on 10/27/2021   Brief hospital course: Ms. Darrion Macaulay is a 66 year old female with history of GERD, restless leg syndrome, hyperlipidemia, insomnia, depression, schizophrenia, dementia, history of DVT on Eliquis, who presented to the emergency department for chief concerns of altered mental status. Patient currently living in a group home, she is complaining of unsteady gait chronically and dysphagia. She was found to have urinary tract infection, started on Rocephin.   Assessment and Plan: Acute metabolic encephalopathy secondary to UTI Urinary tract infection. Patient has been complaining of some urinary symptoms, urine study consistent with UTI.  Pending urine culture results.  Continue Rocephin started in the emergency room. Mental status has improved today.  Dementia without behavioral disturbance. Schizophrenia. Continue home medicines.  Generalized weakness. Start PT/OT.  Dysphagia. Patient has been complaining about food stuck in the throat, but no choking. X-ray did not show any evidence of aspiration pneumonia We will obtain barium esophagram to rule out esophageal stricture.  Chronic anticoagulation. I searched Care Everywhere, did not have any information about this.  Also searched all telemetry strips, EKGs, did not see much information.  EKG yesterday showed sinus rhythm. I also looked at all radiology reports, did not see any study for PE/DVT. At this point, we will continue Eliquis for now     Subjective:  Patient complaining of dysphagia, food stuck in the back of throat.  But no choking. No shortness of breath. Complaining of significant weakness.  Physical Exam: Vitals:   10/26/21 2003 10/27/21 0443 10/27/21 0445 10/27/21 0748  BP: 133/77 (!) 98/51 104/62 115/69  Pulse: 74 71 70 65  Resp: 18 18  18   Temp: 98.1 F (36.7 C)  97.9 F (36.6 C)  98.3 F (36.8 C)  TempSrc:      SpO2: 98% 97%  95%  Weight:      Height:       General exam: Appears calm and comfortable  Respiratory system: Clear to auscultation. Respiratory effort normal. Cardiovascular system: S1 & S2 heard, RRR. No JVD, murmurs, rubs, gallops or clicks. No pedal edema. Gastrointestinal system: Abdomen is nondistended, soft and nontender. No organomegaly or masses felt. Normal bowel sounds heard. Central nervous system: Alert and oriented x3. No focal neurological deficits. Extremities: Symmetric 5 x 5 power. Skin: No rashes, lesions or ulcers Psychiatry: Judgement and insight appear normal. Mood & affect appropriate.   Data Reviewed:  Reviewed radiology results over the past year, reviewed EKG performed yesterday.  Searched care everywhere. Reviewed all lab results.  Family Communication: no family, talked to caretaker  Disposition: Status is: Inpatient Remains inpatient appropriate because: Severity of disease, IV treatment.  Planned Discharge Destination:  Group home    Time spent: 55 minutes, more than 50% time involving direct patient care.  Author: , MD 10/27/2021 11:21 AM  For on call review [email or web address]/[email or web address]/2021.

## 2021-10-27 NOTE — TOC Initial Note (Signed)
Transition of Care [facility]) - Initial/Assessment Note    Patient Details  Name: Kristin Watts MRN: [id number] Date of Birth: 1955-06-22  [facility] of [facility]) CM/SW Contact:    Caryn Section, RN Phone Number: 10/27/2021, 12:47 PM  Clinical Narrative:    Mliss Sax (Other)  [phone] (Mobile) [facility] Group home owner, states pateint can return to group home on discharge, patient also verbalized agreement with plan.               Expected Discharge Plan: Group Home Barriers to Discharge: Continued Medical Work up   Patient Goals and CMS Choice        Expected Discharge Plan and Services Expected Discharge Plan: Group Home   Discharge Planning Services: CM Consult   Living arrangements for the past 2 months: Group Home                                      Prior Living Arrangements/Services Living arrangements for the past 2 months: Group Home Lives with:: Facility Resident ([facility] group [facility]) Patient language and need for interpreter reviewed:: Yes Do you feel safe going back to the place where you live?: Yes      Need for Family Participation in Patient Care: Yes (Comment) Care giver support system in place?: Yes (comment)   Criminal Activity/Legal Involvement Pertinent to Current Situation/Hospitalization: No - Comment as needed  Activities of Daily Living Home Assistive Devices/Equipment: Bedside commode/3-in-1 ADL Screening (condition at time of admission) Patient's cognitive ability adequate to safely complete daily activities?: No Is the patient deaf or have difficulty hearing?: No Does the patient have difficulty seeing, even when wearing glasses/contacts?: No Does the patient have difficulty concentrating, remembering, or making decisions?: Yes Patient able to express need for assistance with ADLs?: No Does the patient have difficulty dressing or bathing?: Yes Independently performs ADLs?: No Does the patient have difficulty walking or climbing stairs?:  Yes Weakness of Legs: Both Weakness of Arms/Hands: None  Permission Sought/Granted Permission sought to share information with : Case Manager Permission granted to share information with : Yes, Verbal Permission Granted     Permission granted to share info w AGENCY: [facility]        Emotional Assessment Appearance:: Appears stated age Attitude/Demeanor/Rapport: Gracious Affect (typically observed): Calm Orientation: : Oriented to Self, Oriented to Place (Doesn't know group home name, but knows address.  Knows she is in the hospital, but does not know how she got here) Alcohol / Substance Use: Not Applicable Psych Involvement: No (comment)  Admission diagnosis:  Altered mental status [R41.82] Acute cystitis without hematuria [N30.00] Altered mental status, unspecified altered mental status type [R41.82] Acute metabolic encephalopathy [G93.41] Patient Active Problem List   Diagnosis Date Noted   Acute metabolic encephalopathy 10/27/2021   UTI (urinary tract infection) 10/27/2021   Altered mental status 10/26/2021   Dementia without behavioral disturbance (HCC) 10/26/2021   Schizophrenia (HCC) 10/26/2021   History of DVT (deep vein thrombosis) 10/26/2021   On continuous oral anticoagulation 10/26/2021   Hyperlipidemia 10/26/2021   Depression 10/26/2021   Restless leg syndrome 10/26/2021   Pyuria 10/26/2021   PCP:  Pcp, No Pharmacy:   [facility] [employer] - [place], [place] - [address] [place] [address] Phone: [phone] Fax: [phone]     Social Determinants of Health (SDOH) Interventions    Readmission Risk Interventions  No data to display

## 2021-10-27 NOTE — Evaluation (Signed)
Physical Therapy Evaluation Patient Details Name: Tarhonda Landry MRN: [id number] DOB: 04/24/1956 Today's Date: 10/27/2021  History of Present Illness  Pt is a 66 yo female that presented to the ED for AMS. Workup showed AMS secondary to UTI. PMH of dementia, schizophrenia, GERD, restless leg syndrome, hyperlipidemia, insomnia, depression, DVT on eliquis.   Clinical Impression  Patient alert, agreeable to PT with encouragement, oriented to time and place, self, some situational awareness noted though pt may not be the most reliable historian. Some PLOF information variable, but pt insistent that lately due to falls she has been using a WC and can stand pivot independently OOB and for toileting. Pt endorsed at least 4 falls this month.  She was able to move all extremities against gravity. Supine to sit with supervision. Pt endorsed previous R foot/ankle pain from an "accident" but no complaints with mobility. She was able to stand pivot to [facility] with CGA, no LOB noted. True ambulation deferred due pt stating she is not walking much anymore at baseline. Pt did seem fearful of falling. Overall per patient report pt has had a mild decline in mobility, and could benefit from HHPT to maximize function, safety, and decrease risk of falls.        Recommendations for follow up therapy are one component of a multi-disciplinary discharge planning process, led by the attending physician.  Recommendations may be updated based on patient status, additional functional criteria and insurance authorization.  Follow Up Recommendations Home health PT      Assistance Recommended at Discharge Intermittent Supervision/Assistance  Patient can return home with the following  Assistance with cooking/housework;Assist for transportation;Assistance with feeding;Direct supervision/assist for medications management;Help with stairs or ramp for entrance;A little help with walking and/or transfers    Equipment Recommendations  None recommended by PT  Recommendations for Other Services       Functional Status Assessment Patient has had a recent decline in their functional status and demonstrates the ability to make significant improvements in function in a reasonable and predictable amount of time.     Precautions / Restrictions Precautions Precautions: Fall Restrictions Weight Bearing Restrictions: No      Mobility  Bed Mobility Overal bed mobility: Needs Assistance Bed Mobility: Supine to Sit, Sit to Supine     Supine to sit: Supervision Sit to supine: Supervision   General bed mobility comments: did not require use of bed rails    Transfers Overall transfer level: Needs assistance Equipment used: None Transfers: Bed to chair/wheelchair/BSC Sit to Stand: Min guard                Ambulation/Gait               General Gait Details: deferred pt reported she does not ambulate "much" at the group home  Stairs            Wheelchair Mobility    Modified Rankin (Stroke Patients Only)       Balance Overall balance assessment: Needs assistance Sitting-balance support: Single extremity supported Sitting balance-Leahy Scale: Fair     Standing balance support: Bilateral upper extremity supported, Reliant on assistive device for balance Standing balance-Leahy Scale: Poor                               Pertinent Vitals/Pain Pain Assessment Pain Assessment: No/denies pain    Home Living Family/patient expects to be discharged to:: Group home  Additional Comments: pt reports she used a walk in shower    Prior Function Prior Level of Function : History of Falls (last six months);Patient poor historian/Family not available             Mobility Comments: Pt seems to be an inconsistent historian but reported she stand pivots to her WC mostly now. group home has ramp ADLs Comments: Pt reports that she was able to shower, dress, and  use the bathroom without caregiver assist, but no caregiver present to confirm accuracy.  Pt states that meals were provided at group home.     Hand Dominance        Extremity/Trunk Assessment   Upper Extremity Assessment Upper Extremity Assessment: Generalized weakness    Lower Extremity Assessment Lower Extremity Assessment: Generalized weakness    Cervical / Trunk Assessment Cervical / Trunk Assessment: Kyphotic  Communication   Communication: No difficulties  Cognition Arousal/Alertness: Awake/alert Behavior During Therapy: WFL for tasks assessed/performed Overall Cognitive Status: Within Functional Limits for tasks assessed                                 General Comments: Inconsistent historian.  Pt seemed to confuse days, weeks, and years when discussing PLOF.  Pt oriented to self and being in the hospital.        General Comments General comments (skin integrity, edema, etc.): Sp02 on 2L at 95%, resting.  Room air 90% with activity.  02 donned again upon OT exit.  Pt getting ready to leave for test.    Exercises     Assessment/Plan    PT Assessment Patient needs continued PT services  PT Problem List Decreased strength;Decreased mobility;Decreased activity tolerance;Decreased balance;Decreased knowledge of precautions;Decreased safety awareness;Decreased knowledge of use of DME       PT Treatment Interventions DME instruction;Therapeutic exercise;Gait training;Balance training;Neuromuscular re-education;Stair training;Functional mobility training;Therapeutic activities;Patient/family education    PT Goals (Current goals can be found in the Care Plan section)  Acute Rehab PT Goals Patient Stated Goal: to feel better PT Goal Formulation: With patient Time For Goal Achievement: 11/10/21 Potential to Achieve Goals: Good    Frequency Min 2X/week     Co-evaluation               AM-PAC PT "6 Clicks" Mobility  Outcome Measure Help needed  turning from your back to your side while in a flat bed without using bedrails?: None Help needed moving from lying on your back to sitting on the side of a flat bed without using bedrails?: None Help needed moving to and from a bed to a chair (including a wheelchair)?: None Help needed standing up from a chair using your arms (e.g., wheelchair or bedside chair)?: A Little Help needed to walk in hospital room?: A Little Help needed climbing 3-5 steps with a railing? : A Lot 6 Click Score: 20    End of Session   Activity Tolerance: Patient tolerated treatment well Patient left: in chair;with chair alarm set;with call bell/phone within reach Nurse Communication: Mobility status;Other (comment) (oxygen status) PT Visit Diagnosis: Other abnormalities of gait and mobility (R26.89);Difficulty in walking, not elsewhere classified (R26.2);Muscle weakness (generalized) (M62.81)    Time: 7425-9563 PT Time Calculation (min) (ACUTE ONLY): 13 min   Charges:   PT Evaluation $PT Eval Low Complexity: 1 Low PT Treatments $Therapeutic Activity: 8-22 mins        Olga Coaster PT, DPT 3:51  PM,10/27/21

## 2021-10-28 ENCOUNTER — Inpatient Hospital Stay: Payer: Medicare Other

## 2021-10-28 DIAGNOSIS — R4182 Altered mental status, unspecified: Secondary | ICD-10-CM | POA: Diagnosis not present

## 2021-10-28 NOTE — Progress Notes (Signed)
Physical Therapy Treatment Patient Details Name: Kristin Watts MRN: [id number] DOB: [birth year] Today's Date: 10/28/2021   History of Present Illness Pt is a 66 yo female that presented to the ED for AMS. Workup showed AMS secondary to UTI. PMH of dementia, schizophrenia, GERD, restless leg syndrome, hyperlipidemia, insomnia, depression, DVT on eliquis.    PT Comments    Patient alert, agreeable to PT with some motivation. Overall she demonstrated improved mobility today. Supine to sit modI from flat bed. Sit <> stand with RW and CGA twice, cued for hand placement once. She ambulated ~22ft in the room, no LOB noted. Mild gait path deviation but overall no true physical assistance needed. Pt up in chair with all needs in reach. The patient would benefit from further skilled PT intervention to continue to progress towards goals. Recommendation remains appropriate.     Recommendations for follow up therapy are one component of a multi-disciplinary discharge planning process, led by the attending physician.  Recommendations may be updated based on patient status, additional functional criteria and insurance authorization.  Follow Up Recommendations  Home health PT     Assistance Recommended at Discharge Intermittent Supervision/Assistance  Patient can return home with the following Assistance with cooking/housework;Assist for transportation;Assistance with feeding;Direct supervision/assist for medications management;Help with stairs or ramp for entrance;A little help with walking and/or transfers   Equipment Recommendations  None recommended by PT    Recommendations for Other Services       Precautions / Restrictions Precautions Precautions: Fall Restrictions Weight Bearing Restrictions: No     Mobility  Bed Mobility Overal bed mobility: Needs Assistance Bed Mobility: Supine to Sit, Sit to Supine     Supine to sit: Supervision Sit to supine: Supervision   General bed mobility  comments: did not require use of bed rails    Transfers Overall transfer level: Needs assistance Equipment used: Rolling walker (2 wheels) Transfers: Bed to chair/wheelchair/BSC, Sit to/from Stand Sit to Stand: Min guard   Step pivot transfers: Min guard            Ambulation/Gait Ambulation/Gait assistance: Min guard Gait Distance (Feet): 20 Feet Assistive device: Rolling walker (2 wheels)         General Gait Details: pt able to ambulate today without complaint or LOB   Stairs             Wheelchair Mobility    Modified Rankin (Stroke Patients Only)       Balance Overall balance assessment: Needs assistance   Sitting balance-Leahy Scale: Good Sitting balance - Comments: able to don socks at EOB   Standing balance support: Bilateral upper extremity supported, Reliant on assistive device for balance Standing balance-Leahy Scale: Fair                              Cognition Arousal/Alertness: Awake/alert Behavior During Therapy: WFL for tasks assessed/performed Overall Cognitive Status: Within Functional Limits for tasks assessed                                 General Comments: Inconsistent historian.  Pt oriented to self and being in the hospital.        Exercises      General Comments        Pertinent Vitals/Pain Pain Assessment Pain Assessment: No/denies pain    Home Living  Prior Function            PT Goals (current goals can now be found in the care plan section) Progress towards PT goals: Progressing toward goals    Frequency    Min 2X/week      PT Plan Current plan remains appropriate    Co-evaluation              AM-PAC PT "6 Clicks" Mobility   Outcome Measure  Help needed turning from your back to your side while in a flat bed without using bedrails?: None Help needed moving from lying on your back to sitting on the side of a flat bed without  using bedrails?: None Help needed moving to and from a bed to a chair (including a wheelchair)?: None Help needed standing up from a chair using your arms (e.g., wheelchair or bedside chair)?: None Help needed to walk in hospital room?: A Little Help needed climbing 3-5 steps with a railing? : A Little 6 Click Score: 22    End of Session   Activity Tolerance: Patient tolerated treatment well Patient left: in chair;with chair alarm set;with call bell/phone within reach Nurse Communication: Mobility status PT Visit Diagnosis: Other abnormalities of gait and mobility (R26.89);Difficulty in walking, not elsewhere classified (R26.2);Muscle weakness (generalized) (M62.81)     Time: 6962-9528 PT Time Calculation (min) (ACUTE ONLY): 12 min  Charges:  $Therapeutic Activity: 8-22 mins                     Olga Coaster PT, DPT 2:41 PM,10/28/21

## 2021-10-28 NOTE — Progress Notes (Signed)
PROGRESS NOTE  Kristin Watts HYQ:[id number] DOB: July 19, 1955 DOA: 10/26/2021 PCP: Pcp, No  HPI/Recap of past 24 hours: Ms. Kristin Watts is a 66 year old female with history of GERD, restless leg syndrome, hyperlipidemia, insomnia, depression, schizophrenia, dementia, history of DVT on Eliquis, who presented to the emergency department for chief concerns of altered mental status. Patient currently living in a group home, she is complaining of unsteady gait chronically and dysphagia. She was found to have urinary tract infection, started on Rocephin.  10/28/21: Reports loose stools, afebrile.  Nausea with no vomiting.  Physical exam is essentially benign.  No tenderness with abdominal palpation.  Assessment/Plan: Principal Problem:   Altered mental status Active Problems:   Dementia without behavioral disturbance (HCC)   Schizophrenia (HCC)   History of DVT (deep vein thrombosis)   On continuous oral anticoagulation   Hyperlipidemia   Depression   Restless leg syndrome   Pyuria   Acute metabolic encephalopathy   UTI (urinary tract infection)  Resolved acute metabolic encephalopathy secondary to group B strep UTI Urinary tract infection. Urine culture grew group B strep She is on Rocephin.  Dementia without behavioral disturbance. Schizophrenia. Continue home medicines.   Generalized weakness. Continue PT/OT.  Dysphagia. Patient has been complaining about food stuck in the throat, but no choking. X-ray did not show any evidence of aspiration pneumonia Barium esophagram done on 10/27/2021 showed normal pharyngeal anatomy and motility.  No evidence of esophageal stricture.  Significant esophageal dysmotility with associated tertiary contractions seen throughout the entire esophagus  Chronic anticoagulation. I searched Care Everywhere, did not have any information about this.  Also searched all telemetry strips, EKGs, did not see much information.  EKG yesterday showed sinus  rhythm. I also looked at all radiology reports, did not see any study for PE/DVT. Bilateral Doppler ultrasound to rule out DVT Continue home Eliquis until DVT is ruled out    Code Status: Full code  Family Communication: None at bedside  Disposition Plan: From group home   Consultants: None.  Procedures: None.  Antimicrobials: P.o. fluconazole.  DVT prophylaxis: Eliquis  Status is: Inpatient The patient requires at least 2 midnights for further evaluation and treatment of present condition.    Objective: Vitals:   10/27/21 1541 10/27/21 1949 10/28/21 0507 10/28/21 0756  BP: 127/70 (!) 149/78 124/68 140/63  Pulse: 66 73 67 67  Resp: 18 18 18    Temp: 98.2 F (36.8 C) 99 F (37.2 C) 98.3 F (36.8 C) 98.4 F (36.9 C)  TempSrc: Oral     SpO2: 97% 95% 96% 98%  Weight:      Height:        Intake/Output Summary (Last 24 hours) at 10/28/2021 1416 Last data filed at 10/27/2021 2344 Gross per 24 hour  Intake 460 ml  Output --  Net 460 ml   Filed Weights   10/26/21 1133  Weight: 78.1 kg    Exam:  General: 66 y.o. year-old female frail in no acute distress.  Alert and interactive. Cardiovascular: Regular rate and rhythm with no rubs or gallops.  No thyromegaly or JVD noted.   Respiratory: Clear to auscultation with no wheezes or rales. Good inspiratory effort. Abdomen: Soft nontender nondistended with normal bowel sounds x4 quadrants. Musculoskeletal: No lower extremity edema. 2/4 pulses in all 4 extremities. Skin: No ulcerative lesions noted or rashes, Psychiatry: Mood is appropriate for condition and setting   Data Reviewed: CBC: Recent Labs  Lab 10/26/21 1136  WBC 11.4*  NEUTROABS 6.1  HGB  12.6  HCT 41.5  MCV 103.0*  PLT 391   Basic Metabolic Panel: Recent Labs  Lab 10/26/21 1211  NA 142  K 3.5  CL 108  CO2 24  GLUCOSE 99  BUN 20  CREATININE 1.64*  CALCIUM 10.0   GFR: Estimated Creatinine Clearance: 36.1 mL/min (A) (by C-G formula  based on SCr of 1.64 mg/dL (H)). Liver Function Tests: Recent Labs  Lab 10/26/21 1211  AST 24  ALT 15  ALKPHOS 90  BILITOT 0.5  PROT 6.7  ALBUMIN 3.4*   No results for input(s): "LIPASE", "AMYLASE" in the last 168 hours. Recent Labs  Lab 10/26/21 1209  AMMONIA 23   Coagulation Profile: No results for input(s): "INR", "PROTIME" in the last 168 hours. Cardiac Enzymes: Recent Labs  Lab 10/26/21 1211  CKTOTAL 120   BNP (last 3 results) No results for input(s): "PROBNP" in the last 8760 hours. HbA1C: No results for input(s): "HGBA1C" in the last 72 hours. CBG: No results for input(s): "GLUCAP" in the last 168 hours. Lipid Profile: No results for input(s): "CHOL", "HDL", "LDLCALC", "TRIG", "CHOLHDL", "LDLDIRECT" in the last 72 hours. Thyroid Function Tests: No results for input(s): "TSH", "T4TOTAL", "FREET4", "T3FREE", "THYROIDAB" in the last 72 hours. Anemia Panel: No results for input(s): "VITAMINB12", "FOLATE", "FERRITIN", "TIBC", "IRON", "RETICCTPCT" in the last 72 hours. Urine analysis:    Component Value Date/Time   COLORURINE YELLOW (A) 10/26/2021 1209   APPEARANCEUR TURBID (A) 10/26/2021 1209   LABSPEC 1.017 10/26/2021 1209   PHURINE 5.0 10/26/2021 1209   GLUCOSEU NEGATIVE 10/26/2021 1209   HGBUR LARGE (A) 10/26/2021 1209   BILIRUBINUR NEGATIVE 10/26/2021 1209   KETONESUR NEGATIVE 10/26/2021 1209   PROTEINUR 100 (A) 10/26/2021 1209   NITRITE NEGATIVE 10/26/2021 1209   LEUKOCYTESUR LARGE (A) 10/26/2021 1209   Sepsis Labs: @LABRCNTIP (procalcitonin:4,lacticidven:4)  ) Recent Results (from the past 240 hour(s))  SARS Coronavirus 2 by RT PCR (hospital order, performed in [facility] hospital lab) *cepheid single result test* Anterior Nasal Swab     Status: None   Collection Time: 10/26/21 12:09 PM   Specimen: Anterior Nasal Swab  Result Value Ref Range Status   SARS Coronavirus 2 by RT PCR NEGATIVE NEGATIVE Final    Comment: Performed at [facility], [address] [place], [place]  Urine Culture     Status: Abnormal   Collection Time: 10/26/21 12:09 PM   Specimen: Urine, Clean Catch  Result Value Ref Range Status   Specimen Description   Final    URINE, CLEAN CATCH Performed at [facility], [address] [place], [place]    Special Requests   Final    NONE Performed at [facility], [address] [place], [place]    Culture (A)  Final    >=100,000 COLONIES/mL GROUP B STREP(S.AGALACTIAE)ISOLATED TESTING AGAINST S. AGALACTIAE NOT ROUTINELY PERFORMED DUE TO PREDICTABILITY OF AMP/PEN/VAN SUSCEPTIBILITY. Performed at [facility], [address] [place], [place]    Report Status 10/27/2021 FINAL  Final      Studies: No results found.  Scheduled Meds:  apixaban  5 mg Oral BID   benztropine  0.5 mg Oral BID   melatonin  2.5 mg Oral QHS   mirtazapine  30 mg Oral QHS   pantoprazole  40 mg Oral BID   QUEtiapine  75 mg Oral QHS   rOPINIRole  0.5 mg Oral QHS   rosuvastatin  10 mg Oral QHS  venlafaxine  75 mg Oral BID   vitamin B-12  1,000 mcg Oral Daily    Continuous Infusions:  cefTRIAXone (ROCEPHIN)  IV 1 g (10/28/21 0839)     LOS: 1 day     Darlin Drop, MD [facility] Pager [phone]  If 7PM-7AM, please contact night-coverage [email or web address] Password [facility] 10/28/2021, 2:16 PM

## 2021-10-29 DIAGNOSIS — R4182 Altered mental status, unspecified: Secondary | ICD-10-CM | POA: Diagnosis not present

## 2021-10-29 LAB — GASTROINTESTINAL PANEL BY PCR, STOOL (REPLACES STOOL CULTURE)
Adenovirus F40/41: NOT DETECTED
Astrovirus: NOT DETECTED
Campylobacter species: NOT DETECTED
Cryptosporidium: NOT DETECTED
Cyclospora cayetanensis: NOT DETECTED
Entamoeba histolytica: NOT DETECTED
Enteroaggregative E coli (EAEC): NOT DETECTED
Enteropathogenic E coli (EPEC): DETECTED — AB
Enterotoxigenic E coli (ETEC): NOT DETECTED
Giardia lamblia: NOT DETECTED
Norovirus GI/GII: NOT DETECTED
Plesimonas shigelloides: NOT DETECTED
Rotavirus A: NOT DETECTED
Salmonella species: NOT DETECTED
Sapovirus (I, II, IV, and V): DETECTED — AB
Shiga like toxin producing E coli (STEC): NOT DETECTED
Shigella/Enteroinvasive E coli (EIEC): NOT DETECTED
Vibrio cholerae: NOT DETECTED
Vibrio species: NOT DETECTED
Yersinia enterocolitica: NOT DETECTED

## 2021-10-29 LAB — CREATININE, SERUM
Creatinine, Ser: 1.09 mg/dL — ABNORMAL HIGH (ref 0.44–1.00)
GFR, Estimated: 56 mL/min — ABNORMAL LOW (ref >60)

## 2021-10-29 MED ORDER — AMOXICILLIN-POT CLAVULANATE 875-125 MG PO TABS
1.0000 | ORAL_TABLET | Freq: Two times a day (BID) | ORAL | Status: AC
Start: 2021-10-29 — End: 2021-10-30
  Administered 2021-10-29 – 2021-10-30 (×3): 1 via ORAL
  Filled 2021-10-29 (×3): qty 1

## 2021-10-29 MED ORDER — AMOXICILLIN-POT CLAVULANATE 875-125 MG PO TABS
1.0000 | ORAL_TABLET | Freq: Two times a day (BID) | ORAL | Status: DC
  Administered 2021-10-29: 1 via ORAL
  Filled 2021-10-29: qty 1

## 2021-10-29 NOTE — TOC Progression Note (Addendum)
Transition of Care [facility]) - Progression Note    Patient Details  Name: Corinthian Kost MRN: [id number] Date of Birth: 16-Jan-1956  Transition of [facility]) CM/SW Contact  Liliana Cline, LCSW Phone Number: 10/29/2021, 9:11 AM  Clinical Narrative:   CSW left VM for [facility] requesting return call to discuss PT/OT recs.  9:30- Return call from [place]. She stated they are agreeable to [facility] rec, no agency preference, confirmed home address. Rowan Blase reported patient has access to a RW and can borrow a wheelchair if needed. Rowan Blase is agreeable to a 3in1 being ordered for patient. 3in1 ordered through [employer]. HH referral made to [facility] with [facility].  Rowan Blase stated if patient is DC over the weekend [facility] can contact her.  11:55- [facility] and informed her patient will likely DC over the weekend per rounds. Rowan Blase reported they will transport patient when she is DC. UXLKGMW stated patient's PCP Koren Bound NP. Notified [facility].  Expected Discharge Plan: Group Home Barriers to Discharge: Continued Medical Work up  Expected Discharge Plan and Services Expected Discharge Plan: Group Home   Discharge Planning Services: CM Consult   Living arrangements for the past 2 months: Group Home                                       Social Determinants of Health (SDOH) Interventions    Readmission Risk Interventions     No data to display

## 2021-10-30 DIAGNOSIS — R4182 Altered mental status, unspecified: Secondary | ICD-10-CM | POA: Diagnosis not present

## 2021-10-30 DIAGNOSIS — R110 Nausea: Secondary | ICD-10-CM

## 2021-10-30 MED ORDER — ONDANSETRON HCL 4 MG/2ML IJ SOLN
4.0000 mg | Freq: Four times a day (QID) | INTRAMUSCULAR | Status: DC | PRN
  Administered 2021-10-30 – 2021-11-01 (×4): 4 mg via INTRAVENOUS
  Filled 2021-10-30 (×5): qty 2

## 2021-10-30 MED ORDER — GABAPENTIN 100 MG PO CAPS: 400.0000 mg | ORAL_CAPSULE | Freq: Three times a day (TID) | ORAL | 0 refills | Status: DC

## 2021-10-30 MED ORDER — ONDANSETRON 4 MG PO TBDP
8.0000 mg | ORAL_TABLET | Freq: Once | ORAL | Status: AC
Start: 2021-10-30 — End: 2021-10-30
  Administered 2021-10-30: 8 mg via ORAL
  Filled 2021-10-30: qty 2

## 2021-10-30 NOTE — Progress Notes (Signed)
Occupational Therapy Treatment Patient Details Name: Kristin Watts MRN: [id number] DOB: [birth year] Today's Date: 10/30/2021   History of present illness Pt is a 66 yo female that presented to the ED for AMS. Workup showed AMS secondary to UTI. PMH of dementia, schizophrenia, GERD, restless leg syndrome, hyperlipidemia, insomnia, depression, DVT on eliquis.   OT comments  Pt in recliner upon OT arrival.  Participated in functional mobility with RW and min guard to reach bathroom for toileting.  SBA for toilet transfer with use of grab bar and 1 hand on RW for support.  Min A to manage hospital gown in prep for toileting.  Had planned for grooming at sink but pt abruptly reported the need to sit down d/t nausea and pt impulsively returned to bed.   Managed bed mobility without rail.  OT assisted with set up for sprite at bedside and request for nausea meds.  Overall, functional mobility greatly improved from OT evaluation, but activity is limited currently by nausea.  Pt will continue to benefit from skilled OT in the acute setting to reinforce ADL safety and fall prevention strategies, while working to increase activity tolerance to reduce burden of care on caregivers.     Recommendations for follow up therapy are one component of a multi-disciplinary discharge planning process, led by the attending physician.  Recommendations may be updated based on patient status, additional functional criteria and insurance authorization.    Follow Up Recommendations  Home health OT    Assistance Recommended at Discharge Frequent or constant Supervision/Assistance  Patient can return home with the following  A lot of help with walking and/or transfers;A lot of help with bathing/dressing/bathroom;Assistance with cooking/housework;Direct supervision/assist for medications management;Help with stairs or ramp for entrance   Equipment Recommendations  BSC/3in1 (already delivered in room)          Precautions /  Restrictions Precautions Precautions: Fall Restrictions Weight Bearing Restrictions: No       Mobility Bed Mobility Overal bed mobility: Needs Assistance Bed Mobility: Sit to Supine       Sit to supine: Supervision     Patient Response: Cooperative  Transfers Overall transfer level: Needs assistance Equipment used: Rolling walker (2 wheels) Transfers: Sit to/from Stand Sit to Stand: Min guard           General transfer comment: min guard-supv for all functional transfers.  pt limited today by nausea.     Balance Overall balance assessment: Needs assistance Sitting-balance support: Feet supported Sitting balance-Leahy Scale: Good     Standing balance support: Bilateral upper extremity supported, Reliant on assistive device for balance Standing balance-Leahy Scale: Fair                             ADL either performed or assessed with clinical judgement   ADL Overall ADL's : Needs assistance/impaired Eating/Feeding: Independent   Grooming: Wash/dry hands;Set up;Sitting Grooming Details (indicate cue type and reason): Pt was encouraged to perform grooming in standing but abruptly requested to sit d/t nausea.                 Toilet Transfer: Supervision/safety;Rolling walker (2 wheels);Grab bars   Toileting- Clothing Manipulation and Hygiene: Minimal assistance;Sit to/from stand Toileting - Clothing Manipulation Details (indicate cue type and reason): able to manage toilet hygiene in sitting, but assist to manage hospital gown in standing     Functional mobility during ADLs: Min guard;Rolling walker (2 wheels) General ADL Comments: Ambulated  from recliner to bathroom for toileting with RW and Min guard.  Declined further mobility d/t nausea.    Extremity/Trunk Assessment Upper Extremity Assessment Upper Extremity Assessment: Generalized weakness   Lower Extremity Assessment Lower Extremity Assessment: Generalized weakness         Vision Patient Visual Report: No change from baseline                Cognition Arousal/Alertness: Awake/alert Behavior During Therapy: WFL for tasks assessed/performed Overall Cognitive Status: Within Functional Limits for tasks assessed                                                       General Comments activity limited by nausea.  OT requested nausea meds during session.    Pertinent Vitals/ Pain       Pain Assessment Pain Assessment: No/denies pain                                                          Frequency  Min 2X/week        Progress Toward Goals  OT Goals(current goals can now be found in the care plan section)  Progress towards OT goals: Progressing toward goals  Acute Rehab OT Goals Patient Stated Goal: Go home.  Avoid falling. OT Goal Formulation: With patient Time For Goal Achievement: 11/10/21 Potential to Achieve Goals: Good  Plan                       AM-PAC OT "6 Clicks" Daily Activity     Outcome Measure   Help from another person eating meals?: None Help from another person taking care of personal grooming?: A Little Help from another person toileting, which includes using toliet, bedpan, or urinal?: A Little Help from another person bathing (including washing, rinsing, drying)?: A Little Help from another person to put on and taking off regular upper body clothing?: None Help from another person to put on and taking off regular lower body clothing?: A Little 6 Click Score: 20    End of Session Equipment Utilized During Treatment: Gait belt;Rolling walker (2 wheels)  OT Visit Diagnosis: Unsteadiness on feet (R26.81);History of falling (Z91.81);Muscle weakness (generalized) (M62.81)   Activity Tolerance Other (comment) (limited by nausea)   Patient Left in bed;with call bell/phone within reach;with bed alarm set   Nurse Communication Other (comment) (request for nausea  meds)        Time: 1126-1140 OT Time Calculation (min): 14 min  Charges: OT General Charges $OT Visit: 1 Visit OT Treatments $Self Care/Home Management : 8-22 mins  Danelle Earthly, MS, OTR/L   Otis Dials 10/30/2021, 1:12 PM

## 2021-10-30 NOTE — Progress Notes (Signed)
No IV access. MD aware.

## 2021-10-31 DIAGNOSIS — R4182 Altered mental status, unspecified: Secondary | ICD-10-CM | POA: Diagnosis not present

## 2021-10-31 DIAGNOSIS — R110 Nausea: Secondary | ICD-10-CM | POA: Diagnosis not present

## 2021-10-31 NOTE — Progress Notes (Signed)
Progress Note  Kristin Watts YQM:[id number] DOB: June 15, 1955  PCP: Koren Bound, NP  Admit date: 10/26/2021 Discharge date: 10/31/2021  Time spent: 35 minutes   Recommendations for Outpatient Follow-up:  Follow-up with your primary care provider Take your medications as prescribed Continue PT OT with assistance and fall precautions.  Discharge Diagnoses:  Active Hospital Problems   Diagnosis Date Noted   Altered mental status 10/26/2021   Nausea without vomiting    Acute metabolic encephalopathy 10/27/2021   UTI (urinary tract infection) 10/27/2021   Dementia without behavioral disturbance (HCC) 10/26/2021   Schizophrenia (HCC) 10/26/2021   History of DVT (deep vein thrombosis) 10/26/2021   On continuous oral anticoagulation 10/26/2021   Hyperlipidemia 10/26/2021   Depression 10/26/2021   Restless leg syndrome 10/26/2021   Pyuria 10/26/2021    Resolved Hospital Problems  No resolved problems to display.    Discharge Condition: Stable  Diet recommendation: Resume previous diet.  Vitals:   10/31/21 0556 10/31/21 0916  BP: 110/69 118/80  Pulse: 67 80  Resp: 17 16  Temp: 97.8 F (36.6 C) 98.1 F (36.7 C)  SpO2: 99% 98%    History of present illness:  Ms. Kristin Watts is a 66 year old female with history of GERD, status post antireflux surgery, restless leg syndrome on ropinirole, hyperlipidemia, insomnia, chronic anxiety/depression, schizophrenia, history of DVT on Eliquis, who presented to the emergency department from group home with complaints of altered mental status and dysphagia.  Work-up revealed group B strep UTI, received 3 days of Rocephin.  Switched to Augmentin on 10/29/2021.  Work-up also revealed esophageal dysmotility and spontaneous reflux.  Hospital course complicated by loose stools, stool study returned positive for enteropathogenic E. coli and Sapovirus.  Her loose stools have resolved.   Modified barium swallow 10/27/2021, revealed the following  findings: -Significant esophageal dysmotility with associated tertiary contractions seen throughout the entire esophagus. -Spontaneous gastroesophageal reflux is seen and extends to the mid esophagus.  Persistent nausea without vomiting.  States she is not able to vomit due to prior GI procedure.  Despite being on twice daily PPI and eating small meals throughout the day and abiding by reflux precautions patient continues to have nausea.  GI consulted to further assess.  Discharge delayed due to persistent nausea.  Seen by GI, possible EGD tomorrow.   10/31/21: Reports persistent nausea with inability to vomit due to prior GI surgery, antireflux surgery.    Hospital Course:  Principal Problem:   Altered mental status Active Problems:   Dementia without behavioral disturbance (HCC)   Schizophrenia (HCC)   History of DVT (deep vein thrombosis)   On continuous oral anticoagulation   Hyperlipidemia   Depression   Restless leg syndrome   Pyuria   Acute metabolic encephalopathy   UTI (urinary tract infection)   Nausea without vomiting  Resolved acute metabolic encephalopathy secondary to group B strep UTI. Urine culture obtained on 10/26/2021 grew group B strep Completed Rocephin on 10/29/2021.  She was switched to Augmentin twice daily.  Persistent intractable nausea in the setting of antireflux surgery with inability to vomit due to prior GI surgery Continue PPI twice daily GI following, possible EGD tomorrow.  Schizophrenia. Continue home regimen.   Generalized weakness. Continue PT/OT. Continue fall precautions.  Dysphagia. Patient has been complaining about food stuck in the throat, but no choking. X-ray did not show any evidence of aspiration pneumonia Barium esophagram done on 10/27/2021 showed normal pharyngeal anatomy and motility.  No evidence of esophageal stricture.  Significant esophageal dysmotility  with associated tertiary contractions seen throughout the entire  esophagus Continue reflux precautions as recommended by speech therapist.  Spontaneous reflux Continue PPI twice daily  Intractable nausea Avoid NSAIDs IV antiemetics as needed Appreciate GIs assistance.  Gastroenteritis, enteropathogenic E. coli, Sapovirus. Hospital course complicated by loose stools, stool study returned positive for enteropathogenic E. coli and Sapovirus.   Her loose stools have resolved.  Restless leg syndrome Continue home ropinirole  History of DVT Bilateral lower extremity Doppler ultrasound negative for DVT Eliquis stopped due to high risk of falls and bleeding I searched Care Everywhere, did not have any information about this.  Also searched all telemetry strips, EKGs, did not see much information.  EKG yesterday showed sinus rhythm. I also looked at all radiology reports, did not see any study for PE/DVT. Bilateral lower extremity Doppler ultrasound negative for DVT.       Code Status: Full code   Family Communication: None at bedside   Disposition Plan: From group home when GI signs off.     Consultants: GI   Procedures: None.   Antimicrobials: Rocephin   DVT prophylaxis: SCDs.   Discharge Exam: BP 118/80 (BP Location: Right Arm)   Pulse 80   Temp 98.1 F (36.7 C) (Oral)   Resp 16   Ht 5\' 6"  (1.676 m)   Wt 78.1 kg   SpO2 98%   BMI 27.79 kg/m  General: 66 y.o. year-old female well-developed well-nourished no acute stress.  She is alert oriented x3.   Cardiovascular: Regular rate and rhythm no rubs or gallops.   Respiratory: Clear to auscultation no wheezes or rales.   Abdomen: Soft nontender bowel sounds present.   Musculoskeletal: No lower extremity edema bilaterally. Skin: No ulcerative lesions noted. Psychiatry: Mood is appropriate for condition  Discharge Instructions You were cared for by a hospitalist during your hospital stay. If you have any questions about your discharge medications or the care you received while  you were in the hospital after you are discharged, you can call the unit and asked to speak with the hospitalist on call if the hospitalist that took care of you is not available. Once you are discharged, your primary care physician will handle any further medical issues. Please note that NO REFILLS for any discharge medications will be authorized once you are discharged, as it is imperative that you return to your primary care physician (or establish a relationship with a primary care physician if you do not have one) for your aftercare needs so that they can reassess your need for medications and monitor your lab values.   Allergies as of 10/31/2021   Not on File      Medication List     STOP taking these medications    Eliquis 5 MG Tabs tablet Generic drug: apixaban   traMADol 50 MG tablet Commonly known as: ULTRAM   traZODone 100 MG tablet Commonly known as: DESYREL   zolpidem 5 MG tablet Commonly known as: AMBIEN       TAKE these medications    benztropine 0.5 MG tablet Commonly known as: COGENTIN Take 0.5 mg by mouth 2 (two) times daily.   cholecalciferol 25 MCG (1000 UNIT) tablet Commonly known as: VITAMIN D Take 1,000 Units by mouth daily.   gabapentin 100 MG capsule Commonly known as: NEURONTIN Take 4 capsules (400 mg total) by mouth 3 (three) times daily. What changed:  medication strength when to take this   GNP Melatonin 3 MG Tabs tablet Generic drug:  melatonin Take 3 mg by mouth at bedtime.   hydrOXYzine 50 MG tablet Commonly known as: ATARAX Take 50 mg by mouth 4 (four) times daily.   ketoconazole 2 % cream Commonly known as: NIZORAL Apply 1 Application topically daily.   mirtazapine 30 MG tablet Commonly known as: REMERON Take 30 mg by mouth at bedtime.   pantoprazole 40 MG tablet Commonly known as: PROTONIX Take 40 mg by mouth 2 (two) times daily.   promethazine 25 MG tablet Commonly known as: PHENERGAN Take 25 mg by mouth every 8  (eight) hours as needed.   QUEtiapine 25 MG tablet Commonly known as: SEROQUEL Take 75 mg by mouth at bedtime.   rOPINIRole 0.5 MG tablet Commonly known as: REQUIP Take 0.5 mg by mouth at bedtime.   rosuvastatin 10 MG tablet Commonly known as: CRESTOR Take 10 mg by mouth at bedtime.   Tab-A-Vite Tabs Take 1 tablet by mouth daily.   venlafaxine 75 MG tablet Commonly known as: EFFEXOR Take 75 mg by mouth 2 (two) times daily.   vitamin B-12 1000 MCG tablet Commonly known as: CYANOCOBALAMIN Take 1,000 mcg by mouth daily.               Durable Medical Equipment  (From admission, onward)           Start     Ordered   10/29/21 0948  For home use only DME 3 n 1  Once        10/29/21 0947           Not on File  Follow-up Information     Koren Bound, NP. Call today.   Specialty: Nurse Practitioner Why: Please call for a posthospital follow-up appointment. Contact information: [address] [place] [address] [phone]                  The results of significant diagnostics from this hospitalization (including imaging, microbiology, ancillary and laboratory) are listed below for reference.    Significant Diagnostic Studies: US Venous Img Lower Bilateral (DVT)  Result Date: 10/28/2021 CLINICAL DATA:  Bilateral lower extremity edema. EXAM: BILATERAL LOWER EXTREMITY VENOUS DOPPLER ULTRASOUND TECHNIQUE: Gray-scale sonography with graded compression, as well as color Doppler and duplex ultrasound were performed to evaluate the lower extremity deep venous systems from the level of the common femoral vein and including the common femoral, femoral, profunda femoral, popliteal and calf veins including the posterior tibial, peroneal and gastrocnemius veins when visible. The superficial great saphenous vein was also interrogated. Spectral Doppler was utilized to evaluate flow at rest and with distal augmentation maneuvers in the common femoral,  femoral and popliteal veins. COMPARISON:  None Available. FINDINGS: RIGHT LOWER EXTREMITY Common Femoral Vein: No evidence of thrombus. Normal compressibility, respiratory phasicity and response to augmentation. Saphenofemoral Junction: No evidence of thrombus. Normal compressibility and flow on color Doppler imaging. Profunda Femoral Vein: No evidence of thrombus. Normal compressibility and flow on color Doppler imaging. Femoral Vein: No evidence of thrombus. Normal compressibility, respiratory phasicity and response to augmentation. Popliteal Vein: No evidence of thrombus. Normal compressibility, respiratory phasicity and response to augmentation. Calf Veins: No evidence of thrombus. Normal compressibility and flow on color Doppler imaging. Venous Reflux:  None. Other Findings:  None. LEFT LOWER EXTREMITY Common Femoral Vein: No evidence of thrombus. Normal compressibility, respiratory phasicity and response to augmentation. Saphenofemoral Junction: No evidence of thrombus. Normal compressibility and flow on color Doppler imaging. Profunda Femoral Vein: No evidence of thrombus. Normal compressibility  and flow on color Doppler imaging. Femoral Vein: No evidence of thrombus. Normal compressibility, respiratory phasicity and response to augmentation. Popliteal Vein: No evidence of thrombus. Normal compressibility, respiratory phasicity and response to augmentation. Calf Veins: No evidence of thrombus. Normal compressibility and flow on color Doppler imaging. Venous Reflux:  None. Other Findings:  None. IMPRESSION: No evidence of deep venous thrombosis in either lower extremity. Electronically Signed   By: Lupita Raider M.D.   On: 10/28/2021 16:05   DG ESOPHAGUS W SINGLE CM (SOL OR THIN BA)  Result Date: 10/27/2021 INDICATION: Patient with complaints upper esophagus dysphagia without choking and without concern for aspiration. Request received for barium swallow to evaluate for stricture. EXAM: ESOPHAGUS/BARIUM  SWALLOW/TABLET STUDY TECHNIQUE: Single contrast examination was performed using thin barium liquid. FLUOROSCOPY TIME:  Radiation Exposure Index (as provided by the fluoroscopic device): 20.60 mGy COMPARISON:  NONE. PROCEDURE: Normal pharyngeal anatomy and motility. No laryngeal penetration or tracheal aspiration. Contrast flowed freely through the esophagus without evidence of a stricture or mass. Significant esophageal dysmotility with associated tertiary contractions seen throughout the entire esophagus. Mild spontaneous gastroesophageal reflux is seen and extends to the mid esophagus. No definite hiatal hernia was demonstrated. A limited study was performed as patient is unable to hold the barium independently and unable to stand for the procedure, therefore single contrast thin barium only was used. The patient experienced an episode of emesis mid procedure. COMPLICATIONS: NONE. IMPRESSION: 1.  Normal pharyngeal anatomy and motility. 2.  No evidence of esophageal stricture. 3. Significant esophageal dysmotility with associated tertiary contractions seen throughout the entire esophagus. 4. Spontaneous gastroesophageal reflux is seen and extends to the mid esophagus. 5.  No hiatal hernia seen. This exam was performed by Pattricia Boss PA-C, and was supervised and interpreted by Dr. Bradly Chris. Electronically Signed   By: Signa Kell M.D.   On: 10/27/2021 14:38   CT Renal Stone Study  Result Date: 10/26/2021 CLINICAL DATA:  Flank pain.  Rule out kidney stone. EXAM: CT ABDOMEN AND PELVIS WITHOUT CONTRAST TECHNIQUE: Multidetector CT imaging of the abdomen and pelvis was performed following the standard protocol without IV contrast. RADIATION DOSE REDUCTION: This exam was performed according to the departmental dose-optimization program which includes automated exposure control, adjustment of the mA and/or kV according to patient size and/or use of iterative reconstruction technique. COMPARISON:  None Available.  FINDINGS: Lower chest: Scarring and or atelectasis identified within both lung bases. No pleural effusions identified. Hepatobiliary: No focal liver abnormality is seen. No gallstones, gallbladder wall thickening, or biliary dilatation. Pancreas: Unremarkable. No pancreatic ductal dilatation or surrounding inflammatory changes. Spleen: Normal in size without focal abnormality. Adrenals/Urinary Tract: Normal adrenal glands. No kidney stones identified bilaterally. No mass or hydronephrosis. No hydroureter or ureteral calculi bilaterally. Urinary bladder is unremarkable. Stomach/Bowel: Stomach appears normal. The appendix is not visualized and may be surgically absent. Small bowel loops appear unremarkable. The colon appears relatively featureless with loss of many of the normal haustral folds. Equivocal areas of mild wall thickening noted involving the proximal sigmoid colon. No significant pericolonic fat stranding. No signs of free fluid or perforation. Vascular/Lymphatic: Aortic atherosclerosis. Infrarenal abdominal aortic aneurysm measures 3.1 cm, image 50/2. There are vascular stents identified within the common iliac veins bilaterally. Small retroperitoneal lymph nodes are identified. No abdominopelvic adenopathy. Reproductive: Uterus and bilateral adnexa are unremarkable. Other: No abdominal wall hernia or abnormality. No abdominopelvic ascites. Musculoskeletal: Status post right hip arthroplasty. No acute or suspicious osseous findings identified. IMPRESSION: 1.  No acute findings within the abdomen or pelvis. 2. No nephrolithiasis or hydronephrosis. 3. The colon appears relatively featureless with loss of many of the normal haustral folds. Equivocal areas of mild wall thickening noted involving the proximal sigmoid colon. No significant pericolonic fat stranding. Findings are nonspecific but may reflect sequelae of chronic colitis. 4. 3.1 cm infrarenal abdominal aortic aneurysm. Recommend follow-up every 3  years. Reference: J Am Coll Radiol 2013;10:789-794. 5. Aortic Atherosclerosis (ICD10-I70.0). Electronically Signed   By: Signa Kell M.D.   On: 10/26/2021 14:17   DG Chest Port 1 View  Result Date: 10/26/2021 CLINICAL DATA:  Altered mental status. EXAM: PORTABLE CHEST 1 VIEW COMPARISON:  None Available. FINDINGS: The patient is rotated to the left, limiting evaluation. Heart size normal given technique and rotation. Low lung volumes are present, causing crowding of the pulmonary vasculature. Mild bibasilar atelectasis. No pneumothorax or pleural effusion. No acute osseous abnormality. IMPRESSION: 1. Low lung volumes and bibasilar atelectasis. Electronically Signed   By: Obie Dredge M.D.   On: 10/26/2021 13:23   CT HEAD WO CONTRAST ( )  Result Date: 10/26/2021 CLINICAL DATA:  Larey Seat.  Hit head. EXAM: CT HEAD WITHOUT CONTRAST CT CERVICAL SPINE WITHOUT CONTRAST TECHNIQUE: Multidetector CT imaging of the head and cervical spine was performed following the standard protocol without intravenous contrast. Multiplanar CT image reconstructions of the cervical spine were also generated. RADIATION DOSE REDUCTION: This exam was performed according to the departmental dose-optimization program which includes automated exposure control, adjustment of the mA and/or kV according to patient size and/or use of iterative reconstruction technique. COMPARISON:  None Available. FINDINGS: CT HEAD FINDINGS Brain: No evidence of acute infarction, hemorrhage, hydrocephalus, extra-axial collection or mass lesion/mass effect. Vascular: Scattered vascular calcifications. No aneurysm or hyperdense vessels. Skull: No skull fracture or bone lesions. Sinuses/Orbits: The paranasal sinuses and mastoid air cells are clear. The globes are intact. Other: No scalp lesions or scalp hematoma. CT CERVICAL SPINE FINDINGS Alignment: Normal Skull base and vertebrae: No acute fracture. No primary bone lesion or focal pathologic process. Soft  tissues and spinal canal: No prevertebral fluid or swelling. No visible canal hematoma. Disc levels: The spinal canal is fairly generous. No large disc protrusions, significant spinal or foraminal stenosis. Upper chest: The lung apices are grossly clear. Other: No lower neck mass, adenopathy or hematoma. 12.5 mm left thyroid nodule with scattered calcifications. Not clinically significant; no follow-up imaging recommended (Ref: J Am Coll Radiol. 2015 Feb;12(2): 143-50). IMPRESSION: 1. No acute intracranial findings or skull fracture. 2. Normal alignment of the cervical spine without acute fracture, disc protrusions or significant spinal or foraminal stenosis. Electronically Signed   By: Rudie Meyer M.D.   On: 10/26/2021 12:53   CT Cervical Spine Wo Contrast  Result Date: 10/26/2021 CLINICAL DATA:  Larey Seat.  Hit head. EXAM: CT HEAD WITHOUT CONTRAST CT CERVICAL SPINE WITHOUT CONTRAST TECHNIQUE: Multidetector CT imaging of the head and cervical spine was performed following the standard protocol without intravenous contrast. Multiplanar CT image reconstructions of the cervical spine were also generated. RADIATION DOSE REDUCTION: This exam was performed according to the departmental dose-optimization program which includes automated exposure control, adjustment of the mA and/or kV according to patient size and/or use of iterative reconstruction technique. COMPARISON:  None Available. FINDINGS: CT HEAD FINDINGS Brain: No evidence of acute infarction, hemorrhage, hydrocephalus, extra-axial collection or mass lesion/mass effect. Vascular: Scattered vascular calcifications. No aneurysm or hyperdense vessels. Skull: No skull fracture or bone lesions. Sinuses/Orbits: The paranasal sinuses and mastoid  air cells are clear. The globes are intact. Other: No scalp lesions or scalp hematoma. CT CERVICAL SPINE FINDINGS Alignment: Normal Skull base and vertebrae: No acute fracture. No primary bone lesion or focal pathologic  process. Soft tissues and spinal canal: No prevertebral fluid or swelling. No visible canal hematoma. Disc levels: The spinal canal is fairly generous. No large disc protrusions, significant spinal or foraminal stenosis. Upper chest: The lung apices are grossly clear. Other: No lower neck mass, adenopathy or hematoma. 12.5 mm left thyroid nodule with scattered calcifications. Not clinically significant; no follow-up imaging recommended (Ref: J Am Coll Radiol. 2015 Feb;12(2): 143-50). IMPRESSION: 1. No acute intracranial findings or skull fracture. 2. Normal alignment of the cervical spine without acute fracture, disc protrusions or significant spinal or foraminal stenosis. Electronically Signed   By: Rudie Meyer M.D.   On: 10/26/2021 12:53    Microbiology: Recent Results (from the past 240 hour(s))  SARS Coronavirus 2 by RT PCR (hospital order, performed in [facility] hospital lab) *cepheid single result test* Anterior Nasal Swab     Status: None   Collection Time: 10/26/21 12:09 PM   Specimen: Anterior Nasal Swab  Result Value Ref Range Status   SARS Coronavirus 2 by RT PCR NEGATIVE NEGATIVE Final    Comment: Performed at [facility], [address], [place], [place] [address]  Urine Culture     Status: Abnormal   Collection Time: 10/26/21 12:09 PM   Specimen: Urine, Clean Catch  Result Value Ref Range Status   Specimen Description   Final    URINE, CLEAN CATCH Performed at [facility], [address], [place], [place] [address]    Special Requests   Final    NONE Performed at [facility], [address], [place], [place] [address]    Culture (A)  Final    >=100,000 COLONIES/mL GROUP B STREP(S.AGALACTIAE)ISOLATED TESTING AGAINST S. AGALACTIAE NOT ROUTINELY PERFORMED DUE TO PREDICTABILITY OF AMP/PEN/VAN SUSCEPTIBILITY. Performed at [facility], [address] [place], [place] [address]    Report Status 10/27/2021 FINAL  Final  Gastrointestinal  Panel by PCR , Stool     Status: Abnormal   Collection Time: 10/28/21  8:59 AM   Specimen: Stool  Result Value Ref Range Status   Campylobacter species NOT DETECTED NOT DETECTED Final   Plesimonas shigelloides NOT DETECTED NOT DETECTED Final   Salmonella species NOT DETECTED NOT DETECTED Final   Yersinia enterocolitica NOT DETECTED NOT DETECTED Final   Vibrio species NOT DETECTED NOT DETECTED Final   Vibrio cholerae NOT DETECTED NOT DETECTED Final   Enteroaggregative E coli (EAEC) NOT DETECTED NOT DETECTED Final   Enteropathogenic E coli (EPEC) DETECTED (A) NOT DETECTED Final    Comment: RESULT CALLED TO, READ BACK BY AND VERIFIED WITH: Forestine Na, RN 1233 10/29/21 GM    Enterotoxigenic E coli (ETEC) NOT DETECTED NOT DETECTED Final   Shiga like toxin producing E coli (STEC) NOT DETECTED NOT DETECTED Final   Shigella/Enteroinvasive E coli (EIEC) NOT DETECTED NOT DETECTED Final   Cryptosporidium NOT DETECTED NOT DETECTED Final   Cyclospora cayetanensis NOT DETECTED NOT DETECTED Final   Entamoeba histolytica NOT DETECTED NOT DETECTED Final   Giardia lamblia NOT DETECTED NOT DETECTED Final   Adenovirus F40/41 NOT DETECTED NOT DETECTED Final   Astrovirus NOT DETECTED NOT DETECTED Final   Norovirus GI/GII NOT DETECTED NOT DETECTED Final   Rotavirus A NOT DETECTED NOT DETECTED Final   Sapovirus (I, II, IV,  and V) DETECTED (A) NOT DETECTED Final    Comment: Performed at [facility], [address] [place], [place] [address]     Labs: Basic Metabolic Panel: Recent Labs  Lab 10/26/21 1211 10/29/21 0608  NA 142  --   K 3.5  --   CL 108  --   CO2 24  --   GLUCOSE 99  --   BUN 20  --   CREATININE 1.64* 1.09*  CALCIUM 10.0  --    Liver Function Tests: Recent Labs  Lab 10/26/21 1211  AST 24  ALT 15  ALKPHOS 90  BILITOT 0.5  PROT 6.7  ALBUMIN 3.4*   No results for input(s): "LIPASE", "AMYLASE" in the last 168 hours. Recent Labs  Lab 10/26/21 1209  AMMONIA 23    CBC: Recent Labs  Lab 10/26/21 1136  WBC 11.4*  NEUTROABS 6.1  HGB 12.6  HCT 41.5  MCV 103.0*  PLT 391   Cardiac Enzymes: Recent Labs  Lab 10/26/21 1211  CKTOTAL 120   BNP: BNP (last 3 results) No results for input(s): "BNP" in the last 8760 hours.  ProBNP (last 3 results) No results for input(s): "PROBNP" in the last 8760 hours.  CBG: No results for input(s): "GLUCAP" in the last 168 hours.     Signed:  Darlin Drop, MD [facility] 10/31/2021, 3:37 PM

## 2021-11-01 ENCOUNTER — Inpatient Hospital Stay: Payer: Medicare Other | Admitting: Anesthesiology

## 2021-11-01 ENCOUNTER — Encounter
Admission: EM | Disposition: A | Discharge status: Home Health | Payer: Self-pay | Source: Skilled Nursing Facility | Attending: Internal Medicine

## 2021-11-01 DIAGNOSIS — R4182 Altered mental status, unspecified: Secondary | ICD-10-CM | POA: Diagnosis not present

## 2021-11-01 HISTORY — PX: ESOPHAGOGASTRODUODENOSCOPY (EGD) WITH PROPOFOL: SHX5813

## 2021-11-01 SURGERY — ESOPHAGOGASTRODUODENOSCOPY (EGD) WITH PROPOFOL
Anesthesia: General

## 2021-11-01 MED ORDER — LIDOCAINE HCL (CARDIAC) PF 100 MG/5ML IV SOSY
PREFILLED_SYRINGE | INTRAVENOUS | Status: DC | PRN
  Administered 2021-11-01: 50 mg via INTRAVENOUS

## 2021-11-01 MED ORDER — PROPOFOL 500 MG/50ML IV EMUL
INTRAVENOUS | Status: DC | PRN
  Administered 2021-11-01: 150 ug/kg/min via INTRAVENOUS

## 2021-11-01 MED ORDER — PROPOFOL 10 MG/ML IV BOLUS
INTRAVENOUS | Status: DC | PRN
  Administered 2021-11-01: 70 mg via INTRAVENOUS
  Administered 2021-11-01: 50 mg via INTRAVENOUS

## 2021-11-01 MED ORDER — SODIUM CHLORIDE 0.9 % IV SOLN
INTRAVENOUS | Status: DC
Start: 2021-11-01 — End: 2021-11-01

## 2021-11-01 NOTE — Discharge Summary (Signed)
Discharge Summary  Kristin Watts ZOX:[id number] DOB: [birth year]  PCP: Koren Bound, NP  Admit date: 10/26/2021 Discharge date: 11/01/2021  Time spent: 35 minutes.  Recommendations for Outpatient Follow-up:  Follow-up with your primary care provider. Take your medications as prescribed. EGD done on 11/01/2021 showing normal esophagus, normal stomach, normal duodenum.  Biopsies obtained and are pending.  Awaiting pathology results. Continue PT OT with assistance and fall precautions.  Discharge Diagnoses:  Active Hospital Problems   Diagnosis Date Noted   Altered mental status 10/26/2021   Nausea without vomiting    Acute metabolic encephalopathy 10/27/2021   UTI (urinary tract infection) 10/27/2021   Dementia without behavioral disturbance (HCC) 10/26/2021   Schizophrenia (HCC) 10/26/2021   History of DVT (deep vein thrombosis) 10/26/2021   On continuous oral anticoagulation 10/26/2021   Hyperlipidemia 10/26/2021   Depression 10/26/2021   Restless leg syndrome 10/26/2021   Pyuria 10/26/2021    Resolved Hospital Problems  No resolved problems to display.    Discharge Condition: Stable  Diet recommendation: Small frequent meals throughout the day.  After eating wait at least 45 minutes prior to laying down supine.  Vitals:   11/01/21 1117 11/01/21 1318  BP: 128/81 123/76  Pulse: 70   Resp: 16   Temp: 98.1 F (36.7 C) 98.2 F (36.8 C)  SpO2: 95%     History of present illness:  Ms. Kristin Watts is a 66 year old female with history of GERD, status post antireflux surgery, restless leg syndrome on ropinirole, hyperlipidemia, insomnia, chronic anxiety/depression, schizophrenia, history of DVT on Eliquis, who presented to the emergency department from group home with complaints of altered mental status and dysphagia.  Work-up revealed group B strep UTI, received 3 days of Rocephin.  Switched to Augmentin on 10/29/2021.  Work-up also revealed esophageal dysmotility and  spontaneous reflux.  Hospital course complicated by loose stools, stool study returned positive for enteropathogenic E. coli and Sapovirus.  Her loose stools have resolved.   Modified barium swallow 10/27/2021, revealed the following findings: -Significant esophageal dysmotility with associated tertiary contractions seen throughout the entire esophagus. -Spontaneous gastroesophageal reflux is seen and extends to the mid esophagus.   Hospital course also complicated by persistent nausea without vomiting.  States she is not able to vomit due to prior GI procedure.   Despite being on twice daily PPI and eating small meals throughout the day and abiding by reflux precautions patient continued to have nausea.  GI was consulted.  The patient had EGD done 11/01/2021 which showed normal esophagus, normal stomach, normal duodenum.  GI recommended antiemetics as needed.     11/01/21: Seen at bedside.  No new complaints.  Hospital Course:  Principal Problem:   Altered mental status Active Problems:   Dementia without behavioral disturbance (HCC)   Schizophrenia (HCC)   History of DVT (deep vein thrombosis)   On continuous oral anticoagulation   Hyperlipidemia   Depression   Restless leg syndrome   Pyuria   Acute metabolic encephalopathy   UTI (urinary tract infection)   Nausea without vomiting  Resolved acute metabolic encephalopathy secondary to group B strep UTI. Urine culture obtained on 10/26/2021 grew group B strep Completed Rocephin on 10/29/2021.  She was switched to Augmentin twice daily, completed course of antibiotic.   Intractable nausea in the setting of antireflux surgery EGD done on 11/01/2021, unremarkable.  Biopsies taken, awaiting pathology results. Her nausea is possibly central. with inability to vomit due to prior GI surgery Continue home regimen  Schizophrenia. Continue home  regimen.   Generalized weakness. Continue PT/OT with assistance. Continue fall  precautions.  Dysphagia. X-ray did not show any evidence of aspiration pneumonia Barium esophagram done on 10/27/2021 showed normal pharyngeal anatomy and motility.  No evidence of esophageal stricture.  Significant esophageal dysmotility with associated tertiary contractions seen throughout the entire esophagus Continue reflux precautions as recommended by speech therapist.   Spontaneous reflux Continue PPI  Reflux precautions, wait at least 45 minutes in the upright position after eating prior to laying down supine.   Intractable nausea Avoid NSAIDs Antiemetics as needed Appreciate GIs assistance.   Gastroenteritis, enteropathogenic E. coli, Sapovirus. Hospital course complicated by loose stools, stool study returned positive for enteropathogenic E. coli and Sapovirus.   Her loose stools have resolved.   Restless leg syndrome Continue home ropinirole   History of DVT Bilateral lower extremity Doppler ultrasound negative for DVT Eliquis stopped due to high risk of falls and bleeding I searched Care Everywhere, did not have any information about this.  Also searched all telemetry strips, EKGs, did not see much information.  EKG yesterday showed sinus rhythm. I also looked at all radiology reports, did not see any study for PE/DVT. Bilateral lower extremity Doppler ultrasound negative for DVT.       Code Status: Full code    Consultants: GI   Procedures: None.   Antimicrobials: Rocephin  Procedures: EGD on 11/01/2021  Discharge Exam: BP 123/76   Pulse 70   Temp 98.2 F (36.8 C) (Temporal)   Resp 16   Ht 5\' 6"  (1.676 m)   Wt 78.1 kg   SpO2 95%   BMI 27.79 kg/m  General: 66 y.o. year-old female well developed well nourished in no acute distress.  Alert and oriented x3. Cardiovascular: Regular rate and rhythm with no rubs or gallops.  No thyromegaly or JVD noted.   Respiratory: Clear to auscultation with no wheezes or rales. Good inspiratory effort. Abdomen:  Soft nontender nondistended with normal bowel sounds x4 quadrants. Musculoskeletal: No lower extremity edema. 2/4 pulses in all 4 extremities. Skin: No ulcerative lesions noted or rashes, Psychiatry: Mood is appropriate for condition and setting  Discharge Instructions You were cared for by a hospitalist during your hospital stay. If you have any questions about your discharge medications or the care you received while you were in the hospital after you are discharged, you can call the unit and asked to speak with the hospitalist on call if the hospitalist that took care of you is not available. Once you are discharged, your primary care physician will handle any further medical issues. Please note that NO REFILLS for any discharge medications will be authorized once you are discharged, as it is imperative that you return to your primary care physician (or establish a relationship with a primary care physician if you do not have one) for your aftercare needs so that they can reassess your need for medications and monitor your lab values.   Allergies as of 11/01/2021       Reactions   Hydrocodone    Oxycodone    Penicillins    Sulfa Antibiotics         Medication List     STOP taking these medications    Eliquis 5 MG Tabs tablet Generic drug: apixaban   traMADol 50 MG tablet Commonly known as: ULTRAM   traZODone 100 MG tablet Commonly known as: DESYREL   zolpidem 5 MG tablet Commonly known as: AMBIEN       TAKE  these medications    benztropine 0.5 MG tablet Commonly known as: COGENTIN Take 0.5 mg by mouth 2 (two) times daily.   cholecalciferol 25 MCG (1000 UNIT) tablet Commonly known as: VITAMIN D Take 1,000 Units by mouth daily.   gabapentin 100 MG capsule Commonly known as: NEURONTIN Take 4 capsules (400 mg total) by mouth 3 (three) times daily. What changed:  medication strength when to take this   GNP Melatonin 3 MG Tabs tablet Generic drug: melatonin Take  3 mg by mouth at bedtime.   hydrOXYzine 50 MG tablet Commonly known as: ATARAX Take 50 mg by mouth 4 (four) times daily.   ketoconazole 2 % cream Commonly known as: NIZORAL Apply 1 Application topically daily.   mirtazapine 30 MG tablet Commonly known as: REMERON Take 30 mg by mouth at bedtime.   pantoprazole 40 MG tablet Commonly known as: PROTONIX Take 40 mg by mouth 2 (two) times daily.   promethazine 25 MG tablet Commonly known as: PHENERGAN Take 25 mg by mouth every 8 (eight) hours as needed.   QUEtiapine 25 MG tablet Commonly known as: SEROQUEL Take 75 mg by mouth at bedtime.   rOPINIRole 0.5 MG tablet Commonly known as: REQUIP Take 0.5 mg by mouth at bedtime.   rosuvastatin 10 MG tablet Commonly known as: CRESTOR Take 10 mg by mouth at bedtime.   Tab-A-Vite Tabs Take 1 tablet by mouth daily.   venlafaxine 75 MG tablet Commonly known as: EFFEXOR Take 75 mg by mouth 2 (two) times daily.   vitamin B-12 1000 MCG tablet Commonly known as: CYANOCOBALAMIN Take 1,000 mcg by mouth daily.               Durable Medical Equipment  (From admission, onward)           Start     Ordered   10/29/21 0948  For home use only DME 3 n 1  Once        10/29/21 0947           Allergies  Allergen Reactions   Hydrocodone    Oxycodone    Penicillins    Sulfa Antibiotics     Follow-up Information     Koren Bound, NP. Call today.   Specialty: Nurse Practitioner Why: Please call for a posthospital follow-up appointment .    Patient to make own follow up appt Office not answering Contact information: [address] [place] [facility] [address] [phone]                  The results of significant diagnostics from this hospitalization (including imaging, microbiology, ancillary and laboratory) are listed below for reference.    Significant Diagnostic Studies: US Venous Img Lower Bilateral (DVT)  Result Date: 10/28/2021 CLINICAL DATA:   Bilateral lower extremity edema. EXAM: BILATERAL LOWER EXTREMITY VENOUS DOPPLER ULTRASOUND TECHNIQUE: Gray-scale sonography with graded compression, as well as color Doppler and duplex ultrasound were performed to evaluate the lower extremity deep venous systems from the level of the common femoral vein and including the common femoral, femoral, profunda femoral, popliteal and calf veins including the posterior tibial, peroneal and gastrocnemius veins when visible. The superficial great saphenous vein was also interrogated. Spectral Doppler was utilized to evaluate flow at rest and with distal augmentation maneuvers in the common femoral, femoral and popliteal veins. COMPARISON:  None Available. FINDINGS: RIGHT LOWER EXTREMITY Common Femoral Vein: No evidence of thrombus. Normal compressibility, respiratory phasicity and response to augmentation. Saphenofemoral Junction: No evidence  of thrombus. Normal compressibility and flow on color Doppler imaging. Profunda Femoral Vein: No evidence of thrombus. Normal compressibility and flow on color Doppler imaging. Femoral Vein: No evidence of thrombus. Normal compressibility, respiratory phasicity and response to augmentation. Popliteal Vein: No evidence of thrombus. Normal compressibility, respiratory phasicity and response to augmentation. Calf Veins: No evidence of thrombus. Normal compressibility and flow on color Doppler imaging. Venous Reflux:  None. Other Findings:  None. LEFT LOWER EXTREMITY Common Femoral Vein: No evidence of thrombus. Normal compressibility, respiratory phasicity and response to augmentation. Saphenofemoral Junction: No evidence of thrombus. Normal compressibility and flow on color Doppler imaging. Profunda Femoral Vein: No evidence of thrombus. Normal compressibility and flow on color Doppler imaging. Femoral Vein: No evidence of thrombus. Normal compressibility, respiratory phasicity and response to augmentation. Popliteal Vein: No evidence of  thrombus. Normal compressibility, respiratory phasicity and response to augmentation. Calf Veins: No evidence of thrombus. Normal compressibility and flow on color Doppler imaging. Venous Reflux:  None. Other Findings:  None. IMPRESSION: No evidence of deep venous thrombosis in either lower extremity. Electronically Signed   By: Lupita Raider M.D.   On: 10/28/2021 16:05   DG ESOPHAGUS W SINGLE CM (SOL OR THIN BA)  Result Date: 10/27/2021 INDICATION: Patient with complaints upper esophagus dysphagia without choking and without concern for aspiration. Request received for barium swallow to evaluate for stricture. EXAM: ESOPHAGUS/BARIUM SWALLOW/TABLET STUDY TECHNIQUE: Single contrast examination was performed using thin barium liquid. FLUOROSCOPY TIME:  Radiation Exposure Index (as provided by the fluoroscopic device): 20.60 mGy COMPARISON:  NONE. PROCEDURE: Normal pharyngeal anatomy and motility. No laryngeal penetration or tracheal aspiration. Contrast flowed freely through the esophagus without evidence of a stricture or mass. Significant esophageal dysmotility with associated tertiary contractions seen throughout the entire esophagus. Mild spontaneous gastroesophageal reflux is seen and extends to the mid esophagus. No definite hiatal hernia was demonstrated. A limited study was performed as patient is unable to hold the barium independently and unable to stand for the procedure, therefore single contrast thin barium only was used. The patient experienced an episode of emesis mid procedure. COMPLICATIONS: NONE. IMPRESSION: 1.  Normal pharyngeal anatomy and motility. 2.  No evidence of esophageal stricture. 3. Significant esophageal dysmotility with associated tertiary contractions seen throughout the entire esophagus. 4. Spontaneous gastroesophageal reflux is seen and extends to the mid esophagus. 5.  No hiatal hernia seen. This exam was performed by Pattricia Boss PA-C, and was supervised and interpreted by  Dr. Bradly Chris. Electronically Signed   By: Signa Kell M.D.   On: 10/27/2021 14:38   CT Renal Stone Study  Result Date: 10/26/2021 CLINICAL DATA:  Flank pain.  Rule out kidney stone. EXAM: CT ABDOMEN AND PELVIS WITHOUT CONTRAST TECHNIQUE: Multidetector CT imaging of the abdomen and pelvis was performed following the standard protocol without IV contrast. RADIATION DOSE REDUCTION: This exam was performed according to the departmental dose-optimization program which includes automated exposure control, adjustment of the mA and/or kV according to patient size and/or use of iterative reconstruction technique. COMPARISON:  None Available. FINDINGS: Lower chest: Scarring and or atelectasis identified within both lung bases. No pleural effusions identified. Hepatobiliary: No focal liver abnormality is seen. No gallstones, gallbladder wall thickening, or biliary dilatation. Pancreas: Unremarkable. No pancreatic ductal dilatation or surrounding inflammatory changes. Spleen: Normal in size without focal abnormality. Adrenals/Urinary Tract: Normal adrenal glands. No kidney stones identified bilaterally. No mass or hydronephrosis. No hydroureter or ureteral calculi bilaterally. Urinary bladder is unremarkable. Stomach/Bowel: Stomach appears normal. The  appendix is not visualized and may be surgically absent. Small bowel loops appear unremarkable. The colon appears relatively featureless with loss of many of the normal haustral folds. Equivocal areas of mild wall thickening noted involving the proximal sigmoid colon. No significant pericolonic fat stranding. No signs of free fluid or perforation. Vascular/Lymphatic: Aortic atherosclerosis. Infrarenal abdominal aortic aneurysm measures 3.1 cm, image 50/2. There are vascular stents identified within the common iliac veins bilaterally. Small retroperitoneal lymph nodes are identified. No abdominopelvic adenopathy. Reproductive: Uterus and bilateral adnexa are unremarkable.  Other: No abdominal wall hernia or abnormality. No abdominopelvic ascites. Musculoskeletal: Status post right hip arthroplasty. No acute or suspicious osseous findings identified. IMPRESSION: 1. No acute findings within the abdomen or pelvis. 2. No nephrolithiasis or hydronephrosis. 3. The colon appears relatively featureless with loss of many of the normal haustral folds. Equivocal areas of mild wall thickening noted involving the proximal sigmoid colon. No significant pericolonic fat stranding. Findings are nonspecific but may reflect sequelae of chronic colitis. 4. 3.1 cm infrarenal abdominal aortic aneurysm. Recommend follow-up every 3 years. Reference: J Am Coll Radiol 2013;10:789-794. 5. Aortic Atherosclerosis (ICD10-I70.0). Electronically Signed   By: Signa Kell M.D.   On: 10/26/2021 14:17   DG Chest Port 1 View  Result Date: 10/26/2021 CLINICAL DATA:  Altered mental status. EXAM: PORTABLE CHEST 1 VIEW COMPARISON:  None Available. FINDINGS: The patient is rotated to the left, limiting evaluation. Heart size normal given technique and rotation. Low lung volumes are present, causing crowding of the pulmonary vasculature. Mild bibasilar atelectasis. No pneumothorax or pleural effusion. No acute osseous abnormality. IMPRESSION: 1. Low lung volumes and bibasilar atelectasis. Electronically Signed   By: Obie Dredge M.D.   On: 10/26/2021 13:23   CT HEAD WO CONTRAST ( )  Result Date: 10/26/2021 CLINICAL DATA:  Larey Seat.  Hit head. EXAM: CT HEAD WITHOUT CONTRAST CT CERVICAL SPINE WITHOUT CONTRAST TECHNIQUE: Multidetector CT imaging of the head and cervical spine was performed following the standard protocol without intravenous contrast. Multiplanar CT image reconstructions of the cervical spine were also generated. RADIATION DOSE REDUCTION: This exam was performed according to the departmental dose-optimization program which includes automated exposure control, adjustment of the mA and/or kV according to  patient size and/or use of iterative reconstruction technique. COMPARISON:  None Available. FINDINGS: CT HEAD FINDINGS Brain: No evidence of acute infarction, hemorrhage, hydrocephalus, extra-axial collection or mass lesion/mass effect. Vascular: Scattered vascular calcifications. No aneurysm or hyperdense vessels. Skull: No skull fracture or bone lesions. Sinuses/Orbits: The paranasal sinuses and mastoid air cells are clear. The globes are intact. Other: No scalp lesions or scalp hematoma. CT CERVICAL SPINE FINDINGS Alignment: Normal Skull base and vertebrae: No acute fracture. No primary bone lesion or focal pathologic process. Soft tissues and spinal canal: No prevertebral fluid or swelling. No visible canal hematoma. Disc levels: The spinal canal is fairly generous. No large disc protrusions, significant spinal or foraminal stenosis. Upper chest: The lung apices are grossly clear. Other: No lower neck mass, adenopathy or hematoma. 12.5 mm left thyroid nodule with scattered calcifications. Not clinically significant; no follow-up imaging recommended (Ref: J Am Coll Radiol. 2015 Feb;12(2): 143-50). IMPRESSION: 1. No acute intracranial findings or skull fracture. 2. Normal alignment of the cervical spine without acute fracture, disc protrusions or significant spinal or foraminal stenosis. Electronically Signed   By: Rudie Meyer M.D.   On: 10/26/2021 12:53   CT Cervical Spine Wo Contrast  Result Date: 10/26/2021 CLINICAL DATA:  Larey Seat.  Hit head. EXAM: CT  HEAD WITHOUT CONTRAST CT CERVICAL SPINE WITHOUT CONTRAST TECHNIQUE: Multidetector CT imaging of the head and cervical spine was performed following the standard protocol without intravenous contrast. Multiplanar CT image reconstructions of the cervical spine were also generated. RADIATION DOSE REDUCTION: This exam was performed according to the departmental dose-optimization program which includes automated exposure control, adjustment of the mA and/or kV  according to patient size and/or use of iterative reconstruction technique. COMPARISON:  None Available. FINDINGS: CT HEAD FINDINGS Brain: No evidence of acute infarction, hemorrhage, hydrocephalus, extra-axial collection or mass lesion/mass effect. Vascular: Scattered vascular calcifications. No aneurysm or hyperdense vessels. Skull: No skull fracture or bone lesions. Sinuses/Orbits: The paranasal sinuses and mastoid air cells are clear. The globes are intact. Other: No scalp lesions or scalp hematoma. CT CERVICAL SPINE FINDINGS Alignment: Normal Skull base and vertebrae: No acute fracture. No primary bone lesion or focal pathologic process. Soft tissues and spinal canal: No prevertebral fluid or swelling. No visible canal hematoma. Disc levels: The spinal canal is fairly generous. No large disc protrusions, significant spinal or foraminal stenosis. Upper chest: The lung apices are grossly clear. Other: No lower neck mass, adenopathy or hematoma. 12.5 mm left thyroid nodule with scattered calcifications. Not clinically significant; no follow-up imaging recommended (Ref: J Am Coll Radiol. 2015 Feb;12(2): 143-50). IMPRESSION: 1. No acute intracranial findings or skull fracture. 2. Normal alignment of the cervical spine without acute fracture, disc protrusions or significant spinal or foraminal stenosis. Electronically Signed   By: Rudie Meyer M.D.   On: 10/26/2021 12:53    Microbiology: Recent Results (from the past 240 hour(s))  SARS Coronavirus 2 by RT PCR (hospital order, performed in [facility] hospital lab) *cepheid single result test* Anterior Nasal Swab     Status: None   Collection Time: 10/26/21 12:09 PM   Specimen: Anterior Nasal Swab  Result Value Ref Range Status   SARS Coronavirus 2 by RT PCR NEGATIVE NEGATIVE Final    Comment: Performed at [facility], [address], [place], [place] [address]  Urine Culture     Status: Abnormal   Collection Time: 10/26/21 12:09 PM   Specimen:  Urine, Clean Catch  Result Value Ref Range Status   Specimen Description   Final    URINE, CLEAN CATCH Performed at [facility], [address] [place], [place] [address]    Special Requests   Final    NONE Performed at [facility], [address], [place], [place] [address]    Culture (A)  Final    >=100,000 COLONIES/mL GROUP B STREP(S.AGALACTIAE)ISOLATED TESTING AGAINST S. AGALACTIAE NOT ROUTINELY PERFORMED DUE TO PREDICTABILITY OF AMP/PEN/VAN SUSCEPTIBILITY. Performed at [facility], [address], [place], [place] [address]    Report Status 10/27/2021 FINAL  Final  Gastrointestinal Panel by PCR , Stool     Status: Abnormal   Collection Time: 10/28/21  8:59 AM   Specimen: Stool  Result Value Ref Range Status   Campylobacter species NOT DETECTED NOT DETECTED Final   Plesimonas shigelloides NOT DETECTED NOT DETECTED Final   Salmonella species NOT DETECTED NOT DETECTED Final   Yersinia enterocolitica NOT DETECTED NOT DETECTED Final   Vibrio species NOT DETECTED NOT DETECTED Final   Vibrio cholerae NOT DETECTED NOT DETECTED Final   Enteroaggregative E coli (EAEC) NOT DETECTED NOT DETECTED Final   Enteropathogenic E coli (EPEC) DETECTED (A) NOT DETECTED Final    Comment: RESULT CALLED TO, READ BACK BY AND VERIFIED WITH: Forestine Na, RN  1233 10/29/21 GM    Enterotoxigenic E coli (ETEC) NOT DETECTED NOT DETECTED Final   Shiga like toxin producing E coli (STEC) NOT DETECTED NOT DETECTED Final   Shigella/Enteroinvasive E coli (EIEC) NOT DETECTED NOT DETECTED Final   Cryptosporidium NOT DETECTED NOT DETECTED Final   Cyclospora cayetanensis NOT DETECTED NOT DETECTED Final   Entamoeba histolytica NOT DETECTED NOT DETECTED Final   Giardia lamblia NOT DETECTED NOT DETECTED Final   Adenovirus F40/41 NOT DETECTED NOT DETECTED Final   Astrovirus NOT DETECTED NOT DETECTED Final   Norovirus GI/GII NOT DETECTED NOT DETECTED Final   Rotavirus A NOT DETECTED NOT  DETECTED Final   Sapovirus (I, II, IV, and V) DETECTED (A) NOT DETECTED Final    Comment: Performed at [facility], [address], [place], [place] [address]     Labs: Basic Metabolic Panel: Recent Labs  Lab 10/26/21 1211 10/29/21 0608  NA 142  --   K 3.5  --   CL 108  --   CO2 24  --   GLUCOSE 99  --   BUN 20  --   CREATININE 1.64* 1.09*  CALCIUM 10.0  --    Liver Function Tests: Recent Labs  Lab 10/26/21 1211  AST 24  ALT 15  ALKPHOS 90  BILITOT 0.5  PROT 6.7  ALBUMIN 3.4*   No results for input(s): "LIPASE", "AMYLASE" in the last 168 hours. Recent Labs  Lab 10/26/21 1209  AMMONIA 23   CBC: Recent Labs  Lab 10/26/21 1136  WBC 11.4*  NEUTROABS 6.1  HGB 12.6  HCT 41.5  MCV 103.0*  PLT 391   Cardiac Enzymes: Recent Labs  Lab 10/26/21 1211  CKTOTAL 120   BNP: BNP (last 3 results) No results for input(s): "BNP" in the last 8760 hours.  ProBNP (last 3 results) No results for input(s): "PROBNP" in the last 8760 hours.  CBG: No results for input(s): "GLUCAP" in the last 168 hours.     Signed:  Darlin Drop, MD [facility] 11/01/2021, 2:19 PM

## 2021-11-02 ENCOUNTER — Encounter: Payer: Self-pay | Admitting: Gastroenterology

## 2021-11-02 LAB — SURGICAL PATHOLOGY

## 2021-12-19 ENCOUNTER — Emergency Department
Admission: EM | Admit: 2021-12-19 | Discharge: 2021-12-23 | Disposition: A | Discharge status: Home / Self Care | Payer: Medicare HMO | Attending: Emergency Medicine | Admitting: Emergency Medicine

## 2021-12-19 ENCOUNTER — Encounter: Payer: Self-pay | Admitting: Emergency Medicine

## 2021-12-19 ENCOUNTER — Emergency Department: Payer: Medicare HMO

## 2021-12-19 ENCOUNTER — Other Ambulatory Visit: Payer: Self-pay

## 2021-12-19 DIAGNOSIS — S0990XA Unspecified injury of head, initial encounter: Secondary | ICD-10-CM | POA: Diagnosis present

## 2021-12-19 DIAGNOSIS — F209 Schizophrenia, unspecified: Secondary | ICD-10-CM | POA: Diagnosis not present

## 2021-12-19 DIAGNOSIS — F0390 Unspecified dementia without behavioral disturbance: Secondary | ICD-10-CM | POA: Insufficient documentation

## 2021-12-19 DIAGNOSIS — S0093XA Contusion of unspecified part of head, initial encounter: Secondary | ICD-10-CM | POA: Diagnosis not present

## 2021-12-19 DIAGNOSIS — R7309 Other abnormal glucose: Secondary | ICD-10-CM | POA: Diagnosis not present

## 2021-12-19 DIAGNOSIS — W010XXA Fall on same level from slipping, tripping and stumbling without subsequent striking against object, initial encounter: Secondary | ICD-10-CM | POA: Insufficient documentation

## 2021-12-19 DIAGNOSIS — W19XXXA Unspecified fall, initial encounter: Secondary | ICD-10-CM

## 2021-12-19 LAB — CBC
HCT: 39 % (ref 36.0–46.0)
Hemoglobin: 12.1 g/dL (ref 12.0–15.0)
MCH: 31.2 pg (ref 26.0–34.0)
MCHC: 31 g/dL (ref 30.0–36.0)
MCV: 100.5 fL — ABNORMAL HIGH (ref 80.0–100.0)
Platelets: 383 10*3/uL (ref 150–400)
RBC: 3.88 MIL/uL (ref 3.87–5.11)
RDW: 16 % — ABNORMAL HIGH (ref 11.5–15.5)
WBC: 11.9 10*3/uL — ABNORMAL HIGH (ref 4.0–10.5)
nRBC: 0 % (ref 0.0–0.2)

## 2021-12-19 LAB — BASIC METABOLIC PANEL
Anion gap: 7 (ref 5–15)
BUN: 21 mg/dL (ref 8–23)
CO2: 26 mmol/L (ref 22–32)
Calcium: 9.6 mg/dL (ref 8.9–10.3)
Chloride: 108 mmol/L (ref 98–111)
Creatinine, Ser: 1.33 mg/dL — ABNORMAL HIGH (ref 0.44–1.00)
GFR, Estimated: 44 mL/min — ABNORMAL LOW (ref >60)
Glucose, Bld: 90 mg/dL (ref 70–99)
Potassium: 4 mmol/L (ref 3.5–5.1)
Sodium: 141 mmol/L (ref 135–145)

## 2021-12-19 MED ORDER — BENZOCAINE 10 % MT GEL
Freq: Four times a day (QID) | OROMUCOSAL | Status: DC | PRN
  Administered 2021-12-21: 1 via OROMUCOSAL
  Filled 2021-12-19: qty 9

## 2021-12-19 MED ORDER — QUETIAPINE FUMARATE 25 MG PO TABS
75.0000 mg | ORAL_TABLET | Freq: Every day | ORAL | Status: DC
  Administered 2021-12-19 – 2021-12-22 (×4): 75 mg via ORAL
  Filled 2021-12-19 (×4): qty 3

## 2021-12-19 MED ORDER — PANTOPRAZOLE SODIUM 40 MG PO TBEC
40.0000 mg | DELAYED_RELEASE_TABLET | Freq: Two times a day (BID) | ORAL | Status: DC
  Administered 2021-12-19 – 2021-12-23 (×8): 40 mg via ORAL
  Filled 2021-12-19 (×8): qty 1

## 2021-12-19 MED ORDER — HYDROXYZINE HCL 25 MG PO TABS
50.0000 mg | ORAL_TABLET | Freq: Four times a day (QID) | ORAL | Status: DC
  Administered 2021-12-19 – 2021-12-23 (×15): 50 mg via ORAL
  Filled 2021-12-19 (×15): qty 2

## 2021-12-19 MED ORDER — VITAMIN D 25 MCG (1000 UNIT) PO TABS
1000.0000 [IU] | ORAL_TABLET | Freq: Every day | ORAL | Status: DC
  Administered 2021-12-20 – 2021-12-23 (×4): 1000 [IU] via ORAL
  Filled 2021-12-19 (×4): qty 1

## 2021-12-19 MED ORDER — ADULT MULTIVITAMIN W/MINERALS CH
1.0000 | ORAL_TABLET | Freq: Every day | ORAL | Status: DC
  Administered 2021-12-20 – 2021-12-23 (×4): 1 via ORAL
  Filled 2021-12-19 (×4): qty 1

## 2021-12-19 MED ORDER — ACETAMINOPHEN 500 MG PO TABS
1000.0000 mg | ORAL_TABLET | Freq: Once | ORAL | Status: AC
  Administered 2021-12-19: 1000 mg via ORAL
  Filled 2021-12-19: qty 2

## 2021-12-19 MED ORDER — MELATONIN 5 MG PO TABS
5.0000 mg | ORAL_TABLET | Freq: Every day | ORAL | Status: DC
  Administered 2021-12-19 – 2021-12-22 (×4): 5 mg via ORAL
  Filled 2021-12-19 (×4): qty 1

## 2021-12-19 MED ORDER — VITAMIN B-12 1000 MCG PO TABS
1000.0000 ug | ORAL_TABLET | Freq: Every day | ORAL | Status: DC
  Administered 2021-12-20 – 2021-12-23 (×4): 1000 ug via ORAL
  Filled 2021-12-19 (×5): qty 1

## 2021-12-19 MED ORDER — ROSUVASTATIN CALCIUM 10 MG PO TABS
10.0000 mg | ORAL_TABLET | Freq: Every day | ORAL | Status: DC
  Administered 2021-12-19 – 2021-12-22 (×3): 10 mg via ORAL
  Filled 2021-12-19 (×5): qty 1

## 2021-12-19 MED ORDER — MIRTAZAPINE 15 MG PO TABS
30.0000 mg | ORAL_TABLET | Freq: Every day | ORAL | Status: DC
  Administered 2021-12-19 – 2021-12-22 (×4): 30 mg via ORAL
  Filled 2021-12-19 (×4): qty 2

## 2021-12-19 MED ORDER — GABAPENTIN 300 MG PO CAPS
400.0000 mg | ORAL_CAPSULE | Freq: Three times a day (TID) | ORAL | Status: DC
  Administered 2021-12-19 – 2021-12-23 (×11): 400 mg via ORAL
  Filled 2021-12-19 (×11): qty 1

## 2021-12-19 MED ORDER — TRAZODONE HCL 50 MG PO TABS
50.0000 mg | ORAL_TABLET | Freq: Every day | ORAL | Status: DC
  Administered 2021-12-19 – 2021-12-22 (×4): 50 mg via ORAL
  Filled 2021-12-19 (×3): qty 1

## 2021-12-19 MED ORDER — ROPINIROLE HCL 0.25 MG PO TABS
0.5000 mg | ORAL_TABLET | Freq: Every day | ORAL | Status: DC
  Administered 2021-12-19 – 2021-12-22 (×3): 0.5 mg via ORAL
  Filled 2021-12-19 (×5): qty 2

## 2021-12-19 MED ORDER — VENLAFAXINE HCL 37.5 MG PO TABS
75.0000 mg | ORAL_TABLET | Freq: Two times a day (BID) | ORAL | Status: DC
Start: 2021-12-19 — End: 2021-12-23
  Administered 2021-12-19 – 2021-12-23 (×7): 75 mg via ORAL
  Filled 2021-12-19 (×9): qty 2

## 2021-12-19 MED ORDER — BENZTROPINE MESYLATE 1 MG PO TABS
0.5000 mg | ORAL_TABLET | Freq: Two times a day (BID) | ORAL | Status: DC
  Administered 2021-12-19 – 2021-12-23 (×8): 0.5 mg via ORAL
  Filled 2021-12-19 (×8): qty 1

## 2021-12-19 NOTE — TOC Initial Note (Addendum)
Transition of Care [facility]) - Initial/Assessment Note    Patient Details  Name: Kristin Watts MRN: [id number] Date of Birth: 1956-04-03  [facility] CM/SW Contact:    Kristin Cline, LCSW Phone Number: 12/19/2021, 3:07 PM  Clinical Narrative:          CSW spoke with MD then spoke with patient.        Per MD, no acute psych issues.  Patient states she was evicted from her group home recently. Patient states she has been staying at [facility] but is out of money and cannot afford to stay there any longer. Patient states she has no one that could help her. Patient states she has no living family. She has a friend Kristin Dandy in [place] but stated she is not able to stay with her.  Patient states she was not sure what her plan was once she had to leave [facility]. Patient states a SW with Adult Protective Services has reached out to her within the last few days and was supposed to apply for emergency housing for her, but she has not heard an update. CSW made request for a return call from On Call APS Worker.    3:45- Call from [place], On Call APS Worker. Kristin Watts stated APS visited patient this past week and provided shelter and emergency housing resources. Kristin Watts reported her Supervisor Kristin Watts) or the assigned SW (Kristin Watts) plan to meet with patient Monday morning. CSW explained to [facility] that patient is ready for DC, we need a disposition for patient. She verbalized understanding.   Expected Discharge Plan: Homeless Shelter Barriers to Discharge: Continued Medical Work up   Patient Goals and CMS Choice Patient states their goals for this hospitalization and ongoing recovery are:: not sure CMS Medicare.gov Compare Post Acute Care list provided to:: Patient Choice offered to / list presented to : Patient  Expected Discharge Plan and Services Expected Discharge Plan: Homeless Shelter       Living arrangements for the past 2 months: Group Home, Hotel/Motel                                       Prior Living Arrangements/Services Living arrangements for the past 2 months: Group Home, Hotel/Motel Lives with:: Self Patient language and need for interpreter reviewed:: Yes Do you feel safe going back to the place where you live?: Yes      Need for Family Participation in Patient Care: Yes (Comment) Care giver support system in place?: Yes (comment)   Criminal Activity/Legal Involvement Pertinent to Current Situation/Hospitalization: No - Comment as needed  Activities of Daily Living      Permission Sought/Granted Permission sought to share information with : Facility Industrial/product designer granted to share information with : Yes, Verbal Permission Granted     Permission granted to share info w AGENCY: APS, other resources as available        Emotional Assessment       Orientation: : Oriented to Self, Oriented to Place, Oriented to  Time, Oriented to Situation Alcohol / Substance Use: Not Applicable Psych Involvement: No (comment)  Admission diagnosis:  Fall ems Patient Active Problem List   Diagnosis Date Noted   Nausea without vomiting    Acute metabolic encephalopathy 10/27/2021   UTI (urinary tract infection) 10/27/2021   Altered mental status 10/26/2021   Dementia without behavioral disturbance (HCC) 10/26/2021   Schizophrenia (HCC)  10/26/2021   History of DVT (deep vein thrombosis) 10/26/2021   On continuous oral anticoagulation 10/26/2021   Hyperlipidemia 10/26/2021   Depression 10/26/2021   Restless leg syndrome 10/26/2021   Pyuria 10/26/2021   PCP:  Kristin Bound, NP Pharmacy:   [facility] - [place], [place] - [address] [place] [address] Phone: [phone] Fax: [phone]     Social Determinants of Health (SDOH) Interventions    Readmission Risk Interventions     No data to display

## 2021-12-19 NOTE — ED Provider Notes (Signed)
[facility] Provider Note    Event Date/Time   First MD Initiated Contact with Patient 12/19/21 1303     (approximate)   History   Chief Complaint Fall   HPI  Kristin Watts is a 66 y.o. female with past medical history of hyperlipidemia, DVT, restless leg syndrome, schizophrenia, and dementia who presents to the ED for fall.  Patient reports that she tripped and fell walking yesterday on a piece of grass near her parking lot.  She states that she hit her head, does not think she lost consciousness.  She does continue to take Eliquis for prior DVT, despite this being stopped during recent admission.  She complains of headache, but denies any neck pain.  She does endorse pain in her left knee and ankle, does state that she has been able to walk since the fall.  She initially presented to med an urgent care, was referred to the ED for further evaluation.  She states that she recently left her group home and has been staying at the [facility], but will have to leave there tomorrow and has nowhere to go after that.     Physical Exam   Triage Vital Signs: ED Triage Vitals  Enc Vitals Group     BP 12/19/21 1252 (!) 107/57     Pulse Rate 12/19/21 1252 (!) 56     Resp 12/19/21 1252 18     Temp 12/19/21 1252 97.6 F (36.4 C)     Temp Source 12/19/21 1252 Axillary     SpO2 12/19/21 1252 100 %     Weight 12/19/21 1250 162 lb (73.5 kg)     Height 12/19/21 1250 5\' 6"  (1.676 m)     Head Circumference --      Peak Flow --      Pain Score 12/19/21 1249 9     Pain Loc --      Pain Edu? --      Excl. in GC? --     Most recent vital signs: Vitals:   12/19/21 1252  BP: (!) 107/57  Pulse: (!) 56  Resp: 18  Temp: 97.6 F (36.4 C)  SpO2: 100%    Constitutional: Awake and alert. Eyes: Conjunctivae are normal. Head: Atraumatic. Nose: No congestion/rhinnorhea. Mouth/Throat: Mucous membranes are moist.  Neck: No midline cervical spine tenderness to  palpation. Cardiovascular: Normal rate, regular rhythm. Grossly normal heart sounds.  2+ radial pulses bilaterally. Respiratory: Normal respiratory effort.  No retractions. Lungs CTAB. Gastrointestinal: Soft and nontender. No distention. Musculoskeletal: No lower extremity tenderness nor edema.  Neurologic:  Normal speech and language. No gross focal neurologic deficits are appreciated.    ED Results / Procedures / Treatments   Labs (all labs ordered are listed, but only abnormal results are displayed) Labs Reviewed  BASIC METABOLIC PANEL - Abnormal; Notable for the following components:      Result Value   Creatinine, Ser 1.33 (*)    GFR, Estimated 44 (*)    All other components within normal limits  CBC - Abnormal; Notable for the following components:   WBC 11.9 (*)    MCV 100.5 (*)    RDW 16.0 (*)    All other components within normal limits  URINALYSIS, ROUTINE W REFLEX MICROSCOPIC  CBG MONITORING, ED     EKG  ED ECG REPORT I, 12/21/21, the attending physician, personally viewed and interpreted this ECG.   Date: 12/19/2021  EKG Time: 12:54  Rate: 66  Rhythm: normal sinus rhythm  Axis: Normal  Intervals:none  ST&T Change: None  RADIOLOGY CT head reviewed and interpreted by me with no hemorrhage or midline shift.  X-rays of left ankle and knee reviewed and interpreted by me with no fracture or dislocation.  PROCEDURES:  Critical Care performed: No  Procedures   MEDICATIONS ORDERED IN ED: Medications - No data to display   IMPRESSION / MDM / ASSESSMENT AND PLAN / ED COURSE  I reviewed the triage vital signs and the nursing notes.                              66 y.o. female with past medical history of hyperlipidemia, DVT, restless leg syndrome, schizophrenia, and dementia who presents to the ED for evaluation following trip and fall yesterday where she struck her head as well as her left leg.  Patient's presentation is most consistent with acute  complicated illness / injury requiring diagnostic workup.  Differential diagnosis includes, but is not limited to, intracranial injury, cervical spine injury, knee fracture, ankle fracture, electrolyte abnormality, AKI.  Patient nontoxic-appearing and in no acute distress, vital signs are unremarkable.  She has a nonfocal neurologic exam and CT head is negative for acute process, no neck pain or midline cervical spine tenderness to suggest cervical spine injury.  Imaging of her left knee and ankle is also unremarkable.  Labs are reassuring with no significant electrolyte abnormality or AKI, mild leukocytosis noted with no signs of infection, no significant anemia noted.  Unfortunately, patient has a complicated living situation and poor capacity to care for herself at this time, does not seem safe for discharge to shelter given her mental illness and dementia.  We will consult social work to assist with appropriate living situation, may need to involve APS.      FINAL CLINICAL IMPRESSION(S) / ED DIAGNOSES   Final diagnoses:  Fall, initial encounter  Contusion of head, unspecified part of head, initial encounter  Schizophrenia, unspecified type (HCC)     Rx / DC Orders   ED Discharge Orders     None        Note:  This document was prepared using Dragon voice recognition software and may include unintentional dictation errors.   Chesley Noon, MD 12/19/21 [phone]

## 2021-12-19 NOTE — ED Triage Notes (Signed)
Pt via EMS from [facility]. Pt had a fall yesterday, does not remember fall. States she did have a head injury but pt does not remember fall. Pt is on Eliquis. States that she has been more tired and headache. Pt c/o L ankle and L knee pain. Pt also endorses headache. Pt is A&Ox4 and NAD.   EM reports 74 HR, 97% on RA, 101/59

## 2021-12-19 NOTE — TOC CM/SW Note (Signed)
TOC consult received for Competency/Guardianship. Asked MD and RN if Psych consult is needed  Per chart review, patient previously resided at [facility]. CSW attempted to reach [place] to determine if this is still accurate, no answer.  TOC will continue to follow.   Alfonso Ramus, [place] [phone]

## 2021-12-19 NOTE — ED Notes (Signed)
Pt has asked Rn to hold off on meds to be given until she speaks to pharmacy for med rec.

## 2021-12-19 NOTE — ED Provider Notes (Signed)
Emergency department handoff note  Care of this patient was signed out to me at the end of the previous provider shift.  All pertinent patient information was conveyed and all questions were answered.  Patient pending social work evaluation prior to final disposition.  Patient did start complaining of sores to her mouth and mouth pain.  On examination, patient has 2 canker sores 1 to the frenulum of the tongue as well as 1 to the mandibular area of the bottom lip just lateral to the midline on the right.  These are ulcerations on erythematous base concerning for likely canker sores.  Patient given order for Orajel for pain management as well as as needed Tylenol for general anti-inflammatory.   Merwyn Katos, MD 12/19/21 [phone]

## 2021-12-19 NOTE — ED Notes (Signed)
Pt denies headache with this RN, alert and oriented in hall with no needs. Will monitor.

## 2021-12-20 DIAGNOSIS — S0093XA Contusion of unspecified part of head, initial encounter: Secondary | ICD-10-CM | POA: Diagnosis not present

## 2021-12-20 MED ORDER — ACETAMINOPHEN 500 MG PO TABS
1000.0000 mg | ORAL_TABLET | Freq: Four times a day (QID) | ORAL | Status: DC | PRN
  Administered 2021-12-20 – 2021-12-21 (×3): 1000 mg via ORAL
  Filled 2021-12-20 (×5): qty 2

## 2021-12-20 NOTE — TOC Progression Note (Addendum)
Transition of Care [facility]) - Progression Note    Patient Details  Name: Kristin Watts MRN: [id number] Date of Birth: [birth year]  [facility] CM/SW Contact  Gildardo Griffes, [place] Phone Number: 12/20/2021, 10:11 AM  Clinical Narrative:     Update: APS called back (from [phone]) to report they are emailing their supervisor for updates and adding this CSW.    CSW lvm with APS for any updates on visit today.   Expected Discharge Plan: Homeless Shelter Barriers to Discharge: Continued Medical Work up  Expected Discharge Plan and Services Expected Discharge Plan: Homeless Shelter       Living arrangements for the past 2 months: Group Home, Hotel/Motel                                       Social Determinants of Health (SDOH) Interventions    Readmission Risk Interventions     No data to display

## 2021-12-20 NOTE — Evaluation (Signed)
Physical Therapy Evaluation Patient Details Name: Kristin Watts MRN: [id number] DOB: [birth year] Today's Date: 12/20/2021  History of Present Illness  Kristin Watts is a 66 y.o. female with past medical history of hyperlipidemia, DVT, restless leg syndrome, schizophrenia, and dementia who presents to the ED for fall.  Patient reports that she tripped and fell walking yesterday on a piece of grass near her parking lot.  She states that she hit her head, does not think she lost consciousness.  She does continue to take Eliquis for prior DVT, despite this being stopped during recent admission.  She complains of headache, but denies any neck pain.  She does endorse pain in her left knee and ankle, does state that she has been able to walk since the fall. X-Ray of knee and ankle negative.  She states that she recently left her group home and has been staying at the [facility], but will have to leave there tomorrow and has nowhere to go after that.  Clinical Impression  Pt received in the ED hallway in a stretcher. Pt is a poor historian. Pt reported that she has severe pain in L calf and is having uncontrolled movements in BUE and BLE which was visible during muscle strength testing and gait.  Pt demonstrated poor balance evident by unsteady and took 13 secs to turn 360 degrees unable ot Single leg standing and tandem standing >10 secs on either leg. Pt has weakness in BLE evident by pt completed 5 x STS test in 52.63 secs.  Pt is at high risk of fall due to balance and weakness in BLE. Pt will benefit form SNF to improve safety using appropriate AD with all functional mobility in order to find a long term placement.      Recommendations for follow up therapy are one component of a multi-disciplinary discharge planning process, led by the attending physician.  Recommendations may be updated based on patient status, additional functional criteria and insurance authorization.  Follow Up Recommendations Skilled  nursing-short term rehab (<3 hours/day) Can patient physically be transported by private vehicle: No    Assistance Recommended at Discharge Intermittent Supervision/Assistance  Patient can return home with the following  A little help with walking and/or transfers;A little help with bathing/dressing/bathroom;Assistance with cooking/housework;Assistance with feeding;Direct supervision/assist for medications management;Direct supervision/assist for financial management;Assist for transportation;Help with stairs or ramp for entrance    Equipment Recommendations Rolling walker (2 wheels)  Recommendations for Other Services       Functional Status Assessment Patient has had a recent decline in their functional status and demonstrates the ability to make significant improvements in function in a reasonable and predictable amount of time.     Precautions / Restrictions Precautions Precautions: Fall Restrictions Weight Bearing Restrictions: No      Mobility  Bed Mobility Overal bed mobility: Independent, Needs Assistance Bed Mobility: Supine to Sit     Supine to sit: Supervision          Transfers Overall transfer level: Needs assistance Equipment used: Rolling walker (2 wheels) Transfers: Sit to/from Stand Sit to Stand: Min assist                Ambulation/Gait Ambulation/Gait assistance: Min assist Gait Distance (Feet): 52 Feet Assistive device: Rolling walker (2 wheels) Gait Pattern/deviations: Shuffle, Antalgic, Decreased stride length Gait velocity: decreased        Stairs    N/A        Wheelchair Mobility; N/A    Modified Rankin (Stroke Patients  Only)       Balance Overall balance assessment: History of Falls, Needs assistance Sitting-balance support: Bilateral upper extremity supported Sitting balance-Leahy Scale: Fair     Standing balance support: Bilateral upper extremity supported Standing balance-Leahy Scale: Fair   Single Leg Stance  - Right Leg: 4 Single Leg Stance - Left Leg: 0 Tandem Stance - Right Leg: 6 Tandem Stance - Left Leg: 4     High level balance activites: Head turns, Turns High Level Balance Comments: slow and unsteady turns (Standing on NBOS with EC 30 secs with Sup) Standardized Balance Assessment Standardized Balance Assessment :  (5 x STS 52.63secs)           Pertinent Vitals/Pain Pain Assessment Pain Assessment: 0-10 Pain Score: 9  Pain Location: L calf and L lateral thigh Pain Descriptors / Indicators: Aching, Sharp Pain Intervention(s): Patient requesting pain meds-RN notified    Home Living Family/patient expects to be discharged to:: Shelter/Homeless                   Additional Comments:  (was living in a group hoem and then in a hotel.)    Prior Function Prior Level of Function : History of Falls (last six months);Patient poor historian/Family not available             Mobility Comments: Pt seems to be an inconsistent historian but she reproted that she ambulated independently at [place] level and community level without AD at group home and at the hotel. ordered home delivered meals to her room. ADLs Comments: Ind with bathing, eating, taking meds.     Hand Dominance        Extremity/Trunk Assessment   Upper Extremity Assessment Upper Extremity Assessment: Generalized weakness    Lower Extremity Assessment Lower Extremity Assessment: Generalized weakness       Communication   Communication: No difficulties  Cognition Arousal/Alertness: Awake/alert Behavior During Therapy: WFL for tasks assessed/performed Overall Cognitive Status: History of cognitive impairments - at baseline Area of Impairment: Problem solving, Memory, Safety/judgement                     Memory: Decreased short-term memory   Safety/Judgement: Decreased awareness of safety   Problem Solving: Slow processing, Difficulty sequencing, Requires verbal cues           General Comments      Exercises     Assessment/Plan    PT Assessment Patient needs continued PT services  PT Problem List Decreased strength;Decreased activity tolerance;Decreased balance;Decreased safety awareness;Pain       PT Treatment Interventions Gait training;Functional mobility training;Therapeutic activities;Therapeutic exercise;Balance training;Neuromuscular re-education;Cognitive remediation;Patient/family education    PT Goals (Current goals can be found in the Care Plan section)  Acute Rehab PT Goals PT Goal Formulation: Patient unable to participate in goal setting Time For Goal Achievement: 01/03/22 Potential to Achieve Goals: Good    Frequency Min 2X/week     Co-evaluation               AM-PAC PT "6 Clicks" Mobility  Outcome Measure Help needed turning from your back to your side while in a flat bed without using bedrails?: A Little Help needed moving from lying on your back to sitting on the side of a flat bed without using bedrails?: A Little Help needed moving to and from a bed to a chair (including a wheelchair)?: A Little Help needed standing up from a chair using your arms (e.g., wheelchair or bedside chair)?:  A Little Help needed to walk in hospital room?: A Little Help needed climbing 3-5 steps with a railing? : Total 6 Click Score: 16    End of Session Equipment Utilized During Treatment: Gait belt Activity Tolerance: Patient tolerated treatment well;Patient limited by pain Patient left: in bed;with nursing/sitter in room Nurse Communication: Mobility status;Precautions;Patient requests pain meds PT Visit Diagnosis: Unsteadiness on feet (R26.81);Other abnormalities of gait and mobility (R26.89);Muscle weakness (generalized) (M62.81);History of falling (Z91.81);Pain;Adult, failure to thrive (R62.7) Pain - Right/Left: Left Pain - part of body: Leg    Time: [id number] PT Time Calculation (min) (ACUTE ONLY): 26 min   Charges:   PT  Evaluation $PT Eval Moderate Complexity: 1 Mod PT Treatments $Gait Training: 8-22 mins $Neuromuscular Re-education: 8-22 mins       Joaquin Music PT DPT 2:33 PM,12/20/21

## 2021-12-20 NOTE — ED Notes (Signed)
Ambulated with walker well with no assist. Pt with no needs.

## 2021-12-20 NOTE — [place] FL2 (Signed)
[place] MEDICAID FL2 LEVEL OF CARE SCREENING TOOL     IDENTIFICATION  Patient Name: Kristin Watts Birthdate: [birth year] Sex: female Admission Date (Current Location): 12/19/2021  [facility] [place] Number:  Chiropodist and Address:  [facility], [address] [place], [place] [address]      Provider Number: [id number]  Attending Physician Name and Address:  No att. providers found  Relative Name and Phone Number:  Chales Abrahams (friend) [phone]    Current Level of Care: Hospital Recommended Level of Care: Skilled Nursing Facility Prior Approval Number:    Date Approved/Denied:   PASRR Number: [id number]  Discharge Plan: SNF    Current Diagnoses: Patient Active Problem List   Diagnosis Date Noted   Nausea without vomiting    Acute metabolic encephalopathy 10/27/2021   UTI (urinary tract infection) 10/27/2021   Altered mental status 10/26/2021   Dementia without behavioral disturbance (HCC) 10/26/2021   Schizophrenia (HCC) 10/26/2021   History of DVT (deep vein thrombosis) 10/26/2021   On continuous oral anticoagulation 10/26/2021   Hyperlipidemia 10/26/2021   Depression 10/26/2021   Restless leg syndrome 10/26/2021   Pyuria 10/26/2021    Orientation RESPIRATION BLADDER Height & Weight     Self, Time, Situation, Place  Normal Continent Weight: 162 lb (73.5 kg) Height:  5\' 6"  (167.6 cm)  BEHAVIORAL SYMPTOMS/MOOD NEUROLOGICAL BOWEL NUTRITION STATUS      Continent Diet (see discharge summary)  AMBULATORY STATUS COMMUNICATION OF NEEDS Skin   Limited Assist Verbally Normal                       Personal Care Assistance Level of Assistance  Bathing, Feeding, Dressing, Total care Bathing Assistance: Limited assistance Feeding assistance: Independent Dressing Assistance: Limited assistance Total Care Assistance: Limited assistance   Functional Limitations Info  Sight, Hearing, Speech Sight Info: Adequate Hearing  Info: Adequate Speech Info: Adequate    SPECIAL CARE FACTORS FREQUENCY  PT (By licensed PT), OT (By licensed OT)     PT Frequency: min 4x weekly OT Frequency: min 4x weekly            Contractures Contractures Info: Not present    Additional Factors Info  Code Status, Allergies Code Status Info: full Allergies Info: Hydrocodone   Iodinated Contrast Media   Oxycodone   Penicillins   Sulfa Antibiotics   Toradol (Ketorolac Tromethamine)           Current Medications (12/20/2021):  This is the current hospital active medication list Current Facility-Administered Medications  Medication Dose Route Frequency Provider Last Rate Last Admin   acetaminophen (TYLENOL) tablet 1,000 mg  1,000 mg Oral Q6H PRN 12/22/2021, MD       benzocaine (ORAJEL) 10 % mucosal gel   Mouth/Throat QID PRN Delton Prairie, MD       benztropine (COGENTIN) tablet 0.5 mg  0.5 mg Oral BID Merwyn Katos, MD   0.5 mg at 12/20/21 12/22/21   cholecalciferol (VITAMIN D3) 25 MCG (1000 UNIT) tablet 1,000 Units  1,000 Units Oral Daily 6712, MD   1,000 Units at 12/20/21 12/22/21   cyanocobalamin (VITAMIN B12) tablet 1,000 mcg  1,000 mcg Oral Daily 4580, MD   1,000 mcg at 12/20/21 1116   gabapentin (NEURONTIN) capsule 400 mg  400 mg Oral TID 12/22/21, MD   400 mg at 12/20/21 0951   hydrOXYzine (ATARAX) tablet 50 mg  50 mg Oral QID 12/22/21, MD  50 mg at 12/20/21 0951   melatonin tablet 5 mg  5 mg Oral QHS Merwyn Katos, MD   5 mg at 12/19/21 2150   mirtazapine (REMERON) tablet 30 mg  30 mg Oral QHS Chesley Noon, MD   30 mg at 12/19/21 2152   multivitamin with minerals tablet 1 tablet  1 tablet Oral Daily Chesley Noon, MD   1 tablet at 12/20/21 0951   pantoprazole (PROTONIX) EC tablet 40 mg  40 mg Oral BID Chesley Noon, MD   40 mg at 12/20/21 0951   QUEtiapine (SEROQUEL) tablet 75 mg  75 mg Oral QHS Chesley Noon, MD   75 mg at 12/19/21 2149   rOPINIRole (REQUIP) tablet 0.5 mg   0.5 mg Oral QHS Chesley Noon, MD   0.5 mg at 12/19/21 2151   rosuvastatin (CRESTOR) tablet 10 mg  10 mg Oral QHS Chesley Noon, MD   10 mg at 12/19/21 2151   traZODone (DESYREL) tablet 50 mg  50 mg Oral QHS Merwyn Katos, MD   50 mg at 12/19/21 2152   venlafaxine [facility]) tablet 75 mg  75 mg Oral BID Chesley Noon, MD   75 mg at 12/20/21 1116   Current Outpatient Medications  Medication Sig Dispense Refill   apixaban (ELIQUIS) 5 MG TABS tablet Take 5 mg by mouth every 12 (twelve) hours.     benztropine (COGENTIN) 0.5 MG tablet Take 0.5 mg by mouth 2 (two) times daily.     cholecalciferol (VITAMIN D) 25 MCG (1000 UNIT) tablet Take 1,000 Units by mouth daily.     gabapentin (NEURONTIN) 400 MG capsule Take 400 mg by mouth 3 (three) times daily.     GNP MELATONIN 3 MG TABS tablet Take 3 mg by mouth at bedtime.     hydrOXYzine (ATARAX) 50 MG tablet Take 50 mg by mouth 4 (four) times daily.     mirtazapine (REMERON) 30 MG tablet Take 30 mg by mouth at bedtime.     Multiple Vitamin (TAB-A-VITE) TABS Take 1 tablet by mouth daily.     pantoprazole (PROTONIX) 40 MG tablet Take 40 mg by mouth 2 (two) times daily.     QUEtiapine (SEROQUEL) 25 MG tablet Take 75 mg by mouth at bedtime.     rOPINIRole (REQUIP) 0.5 MG tablet Take 0.5 mg by mouth at bedtime.     rosuvastatin (CRESTOR) 10 MG tablet Take 10 mg by mouth at bedtime.     traZODone (DESYREL) 50 MG tablet Take 50 mg by mouth at bedtime.     venlafaxine (EFFEXOR) 75 MG tablet Take 75 mg by mouth 2 (two) times daily.     vitamin B-12 (CYANOCOBALAMIN) 1000 MCG tablet Take 1,000 mcg by mouth daily.     hydrOXYzine (ATARAX) 25 MG tablet Take 25 mg by mouth daily as needed.     ketoconazole (NIZORAL) 2 % cream Apply 1 Application topically daily.     mirtazapine (REMERON) 30 MG tablet Take 30 mg by mouth at bedtime. (Patient not taking: Reported on 12/19/2021)     promethazine (PHENERGAN) 25 MG tablet Take 25 mg by mouth every 8 (eight) hours as  needed.       Discharge Medications: Please see discharge summary for a list of discharge medications.  Relevant Imaging Results:  Relevant Lab Results:   Additional Information SSN:[id number]  Gildardo Griffes, LCSW

## 2021-12-21 DIAGNOSIS — S0093XA Contusion of unspecified part of head, initial encounter: Secondary | ICD-10-CM | POA: Diagnosis not present

## 2021-12-21 LAB — URINALYSIS, ROUTINE W REFLEX MICROSCOPIC
Bilirubin Urine: NEGATIVE
Glucose, UA: NEGATIVE mg/dL
Ketones, ur: NEGATIVE mg/dL
Nitrite: NEGATIVE
Protein, ur: NEGATIVE mg/dL
Specific Gravity, Urine: 1.011 (ref 1.005–1.030)
pH: 7 (ref 5.0–8.0)

## 2021-12-21 MED ORDER — NICOTINE 21 MG/24HR TD PT24
21.0000 mg | MEDICATED_PATCH | Freq: Every day | TRANSDERMAL | Status: DC
  Administered 2021-12-21 – 2021-12-23 (×3): 21 mg via TRANSDERMAL
  Filled 2021-12-21 (×3): qty 1

## 2021-12-21 NOTE — ED Notes (Signed)
Patient up to restroom at this time. Ambulating with steady gait with walker assistance and 1 assist

## 2021-12-21 NOTE — ED Notes (Signed)
Patient up to the bathroom to change clothes and freshen up with bath wipes. Steady gait with walker and stand by assist

## 2021-12-21 NOTE — Progress Notes (Signed)
Physical Therapy Treatment Patient Details Name: Collins Dimaria MRN: [id number] DOB: December 17, 1955 Today's Date: 12/21/2021   History of Present Illness Shanice Poznanski is a 66 y.o. female with past medical history of hyperlipidemia, DVT, restless leg syndrome, schizophrenia, and dementia who presents to the ED for fall.  Patient reports that she tripped and fell walking yesterday on a piece of grass near her parking lot.  She states that she hit her head, does not think she lost consciousness.  She does continue to take Eliquis for prior DVT, despite this being stopped during recent admission.  She complains of headache, but denies any neck pain.  She does endorse pain in her left knee and ankle, does state that she has been able to walk since the fall. X-Ray of knee and ankle negative.  She states that she recently left her group home and has been staying at the [facility], but will have to leave there tomorrow and has nowhere to go after that.    PT Comments    Patient is agreeable to PT session. She continues to require minimal assistance for ambulation. She needs cue for safety using rolling walker and steadying initially with improving gait pattern with increased ambulation distance. Recommend to continue using rolling walker for safety with mobility. PT will continue to follow to maximize independence and facilitate return to prior level of function.    Recommendations for follow up therapy are one component of a multi-disciplinary discharge planning process, led by the attending physician.  Recommendations may be updated based on patient status, additional functional criteria and insurance authorization.  Follow Up Recommendations  Skilled nursing-short term rehab (<3 hours/day) Can patient physically be transported by private vehicle: No   Assistance Recommended at Discharge Intermittent Supervision/Assistance  Patient can return home with the following A little help with walking and/or  transfers;A little help with bathing/dressing/bathroom;Assistance with cooking/housework;Assistance with feeding;Direct supervision/assist for medications management;Direct supervision/assist for financial management;Assist for transportation;Help with stairs or ramp for entrance   Equipment Recommendations  Rolling walker (2 wheels)    Recommendations for Other Services       Precautions / Restrictions Precautions Precautions: Fall Restrictions Weight Bearing Restrictions: No     Mobility  Bed Mobility Overal bed mobility: Needs Assistance Bed Mobility: Supine to Sit, Sit to Supine     Supine to sit: Modified independent (Device/Increase time) Sit to supine: Min guard   General bed mobility comments: cues for technique. increased time and effort required    Transfers Overall transfer level: Needs assistance Equipment used: Rolling walker (2 wheels) Transfers: Sit to/from Stand Sit to Stand: Min guard           General transfer comment: cues for hand placement for safety    Ambulation/Gait Ambulation/Gait assistance: Min assist, Min guard Gait Distance (Feet): 60 Feet Assistive device: Rolling walker (2 wheels) Gait Pattern/deviations: Antalgic, Decreased stride length, Decreased stance time - left Gait velocity: decreased     General Gait Details: verbal cues for rolling walker placement and to keep both hands on the rolling walker with ambulation for safety. patient with tendency to let go of the walker with distraction. gait pattern improved with increased ambulation distance. Min A initially for steadying, progressing to Min guard with increased ambulation distance   Stairs             Wheelchair Mobility    Modified Rankin (Stroke Patients Only)       Balance Overall balance assessment: History of Falls, Needs assistance  Sitting-balance support: Bilateral upper extremity supported Sitting balance-Leahy Scale: Fair     Standing balance  support: Bilateral upper extremity supported Standing balance-Leahy Scale: Fair Standing balance comment: with rolling walker for UE support                            Cognition Arousal/Alertness: Awake/alert Behavior During Therapy: WFL for tasks assessed/performed Overall Cognitive Status: History of cognitive impairments - at baseline Area of Impairment: Problem solving, Memory, Safety/judgement                     Memory: Decreased short-term memory   Safety/Judgement: Decreased awareness of safety   Problem Solving: Slow processing, Difficulty sequencing, Requires verbal cues          Exercises      General Comments        Pertinent Vitals/Pain Pain Assessment Pain Assessment: Faces Faces Pain Scale: Hurts a little bit Pain Location: left anterior ankle and left calf Pain Descriptors / Indicators: Discomfort Pain Intervention(s): Monitored during session    Home Living                          Prior Function            PT Goals (current goals can now be found in the care plan section) Acute Rehab PT Goals Patient Stated Goal: to get to rehab PT Goal Formulation: With patient Time For Goal Achievement: 01/03/22 Potential to Achieve Goals: Good Progress towards PT goals: Progressing toward goals    Frequency    Min 2X/week      PT Plan Current plan remains appropriate    Co-evaluation              AM-PAC PT "6 Clicks" Mobility   Outcome Measure  Help needed turning from your back to your side while in a flat bed without using bedrails?: A Little Help needed moving from lying on your back to sitting on the side of a flat bed without using bedrails?: A Little Help needed moving to and from a bed to a chair (including a wheelchair)?: A Little Help needed standing up from a chair using your arms (e.g., wheelchair or bedside chair)?: A Little Help needed to walk in hospital room?: A Little Help needed climbing 3-5  steps with a railing? : Total 6 Click Score: 16    End of Session Equipment Utilized During Treatment: Gait belt Activity Tolerance: Patient tolerated treatment well Patient left: in bed;with call bell/phone within reach Nurse Communication: Mobility status PT Visit Diagnosis: Unsteadiness on feet (R26.81);Other abnormalities of gait and mobility (R26.89);Muscle weakness (generalized) (M62.81);History of falling (Z91.81);Pain;Adult, failure to thrive (R62.7) Pain - Right/Left: Left Pain - part of body: Leg     Time: 9622-2979 PT Time Calculation (min) (ACUTE ONLY): 21 min  Charges:  $Gait Training: 8-22 mins                     Donna Bernard, PT, MPT    Ina Homes 12/21/2021, 2:57 PM

## 2021-12-21 NOTE — ED Notes (Signed)
Patient up to restroom with PT provider. Ambulating with steady gait with walker assist and stand by assist

## 2021-12-21 NOTE — TOC Progression Note (Addendum)
Transition of Care [facility]) - Progression Note    Patient Details  Name: Kristin Watts MRN: [id number] Date of Birth: May 08, 1956  Transition of Care [facility]) CM/SW Contact  Gildardo Griffes, [place] Phone Number: 12/21/2021, 1:44 PM  Clinical Narrative:     CSW has reached out to [facility] with [facility] who reports she will re review patient's chart, informed patient is homeless. Pending response from [facility] if able to accept as patient has both [facility] and Medicaid.   Homelessness will be a barrier to SNF placement. CSW consulted with PT, they are awre should no bed offers occur patient will need to continue to work with PT to become stable enough to dc to a shelter.   [facility] reports they were working with patient while she was at [employer] for [facility], she also had a Child psychotherapist with them. They report patient never told them she couldn't stay at [facility] any longer and that their social worker could have assisted in finding a plan for patient if she would have communicated this.   Expected Discharge Plan: Homeless Shelter Barriers to Discharge: Continued Medical Work up  Expected Discharge Plan and Services Expected Discharge Plan: Homeless Shelter       Living arrangements for the past 2 months: Group Home, Hotel/Motel                                       Social Determinants of Health (SDOH) Interventions    Readmission Risk Interventions     No data to display

## 2021-12-22 DIAGNOSIS — S0093XA Contusion of unspecified part of head, initial encounter: Secondary | ICD-10-CM | POA: Diagnosis not present

## 2021-12-22 MED ORDER — ONDANSETRON 4 MG PO TBDP
4.0000 mg | ORAL_TABLET | Freq: Once | ORAL | Status: AC
  Administered 2021-12-22: 4 mg via ORAL
  Filled 2021-12-22: qty 1

## 2021-12-22 NOTE — ED Notes (Signed)
Patient resting in bed free from sign of distress. Breathing unlabored speaking in full sentences with symmetric chest rise and fall. Bed low and locked with side rails raised x1. Pt in direct view of nurses station.

## 2021-12-22 NOTE — ED Notes (Signed)
Patient ambulated to and from bathroom utilizing walker with independent and steady gait. Pt positioned self back in bed for comfort. Bed low and locked with side rails raised x1. Pt directly in front of nurses station. Breathing noted to be unlabored with pt speaking in full sentences and with noted symmetric chest rise and fall. Pt denies pain or other needs at this time.

## 2021-12-22 NOTE — TOC Progression Note (Signed)
Transition of Care [facility]) - Progression Note    Patient Details  Name: Ardath Lepak MRN: [id number] Date of Birth: Jan 03, 1956  [facility]) CM/SW Contact  Allayne Butcher, RN Phone Number: 12/22/2021, 1:36 PM  Clinical Narrative:    Patient has current bed offers from [facility] and [facility].  Patient would like to go closer to [facility] to be closer to her friend.  Bed request sent to [facility] and [facility].   Message left for Admissions to return call at [facility].  Spoke with Admissions at [facility] and they do not have any long term beds.     Expected Discharge Plan: Skilled Nursing Facility Barriers to Discharge: SNF Pending bed offer  Expected Discharge Plan and Services Expected Discharge Plan: Skilled Nursing Facility     Post Acute Care Choice: Skilled Nursing Facility Living arrangements for the past 2 months: Group Home, Hotel/Motel                                       Social Determinants of Health (SDOH) Interventions    Readmission Risk Interventions     No data to display

## 2021-12-22 NOTE — ED Notes (Signed)
Patient resting in bed free from sign of distress. Breathing unlabored speaking in full sentences with symmetric chest rise and fall. Bed low and locked with side rails raised x1. Pt in direct view of nurses station.   Patient provided with italian ice per request.

## 2021-12-22 NOTE — Progress Notes (Signed)
Physical Therapy Treatment Patient Details Name: Kristin Watts MRN: [id number] DOB: 1956-04-19 Today's Date: 12/22/2021   History of Present Illness Leyla Soliz is a 66 y.o. female with past medical history of hyperlipidemia, DVT, restless leg syndrome, schizophrenia, and dementia who presents to the ED for fall.  Patient reports that she tripped and fell walking yesterday on a piece of grass near her parking lot.  She states that she hit her head, does not think she lost consciousness.  She does continue to take Eliquis for prior DVT, despite this being stopped during recent admission.  She complains of headache, but denies any neck pain.  She does endorse pain in her left knee and ankle, does state that she has been able to walk since the fall. X-Ray of knee and ankle negative.  She states that she recently left her group home and has been staying at the [facility], but will have to leave there tomorrow and has nowhere to go after that.    PT Comments    Patient is agreeable to PT. She reports she is still interested in getting to rehab and is not at her baseline level of functional mobility. Ambulation distance increased today, however patient required minimal assistance with walking for steadying using rolling walker for support. Occasionally tremulous in standing and patient fatigued with activity. Therapeutic exercises performed with BLE for strengthening. Recommend to continue PT to maximize independence and facilitate return to prior level of function. SNF is still recommended at this time.    Recommendations for follow up therapy are one component of a multi-disciplinary discharge planning process, led by the attending physician.  Recommendations may be updated based on patient status, additional functional criteria and insurance authorization.  Follow Up Recommendations  Skilled nursing-short term rehab (<3 hours/day) Can patient physically be transported by private vehicle: Yes    Assistance Recommended at Discharge Intermittent Supervision/Assistance  Patient can return home with the following A little help with walking and/or transfers;A little help with bathing/dressing/bathroom;Assistance with cooking/housework;Assistance with feeding;Direct supervision/assist for medications management;Direct supervision/assist for financial management;Assist for transportation;Help with stairs or ramp for entrance   Equipment Recommendations  None recommended by PT    Recommendations for Other Services       Precautions / Restrictions Precautions Precautions: Fall Restrictions Weight Bearing Restrictions: No     Mobility  Bed Mobility Overal bed mobility: Needs Assistance Bed Mobility: Supine to Sit, Sit to Supine     Supine to sit: Modified independent (Device/Increase time) Sit to supine: Min assist   General bed mobility comments: assistance for LE support to return to bed. verbal cues for task initiation    Transfers Overall transfer level: Needs assistance Equipment used: Rolling walker (2 wheels) Transfers: Sit to/from Stand Sit to Stand: Min guard           General transfer comment: multiple standing bouts performed from stretcher. cues for safety    Ambulation/Gait Ambulation/Gait assistance: Min assist Gait Distance (Feet): 120 Feet Assistive device: Rolling walker (2 wheels) Gait Pattern/deviations: Decreased stride length, Trunk flexed Gait velocity: decreased     General Gait Details: patient is tremulous after about 90 ft and requires a short standing rest break due to fatigue. minimal assistance for steadying. occasional cues for rolling walker negotiation   Stairs             Wheelchair Mobility    Modified Rankin (Stroke Patients Only)       Balance Overall balance assessment: History of Falls, Needs  assistance Sitting-balance support: Bilateral upper extremity supported Sitting balance-Leahy Scale: Fair      Standing balance support: Bilateral upper extremity supported Standing balance-Leahy Scale: Fair Standing balance comment: with rolling walker for UE support                            Cognition Arousal/Alertness: Awake/alert Behavior During Therapy: WFL for tasks assessed/performed Overall Cognitive Status: History of cognitive impairments - at baseline                                 General Comments: tangential speech at times with cues required for attention to task        Exercises General Exercises - Lower Extremity Ankle Circles/Pumps: AROM, Strengthening, Both, 10 reps, Supine Heel Slides: Strengthening, Both, 10 reps, Supine, AAROM Hip ABduction/ADduction: AAROM, Strengthening, Both, 10 reps, Supine Straight Leg Raises: AAROM, Strengthening, Both, 10 reps, Supine Other Exercises Other Exercises: verbal cues for exercise technique for strengthening    General Comments        Pertinent Vitals/Pain Pain Assessment Pain Assessment: No/denies pain    Home Living                          Prior Function            PT Goals (current goals can now be found in the care plan section) Acute Rehab PT Goals Patient Stated Goal: to go to rehab PT Goal Formulation: With patient Time For Goal Achievement: 01/03/22 Potential to Achieve Goals: Good Progress towards PT goals: Progressing toward goals    Frequency    Min 2X/week      PT Plan Current plan remains appropriate    Co-evaluation              AM-PAC PT "6 Clicks" Mobility   Outcome Measure  Help needed turning from your back to your side while in a flat bed without using bedrails?: A Little Help needed moving from lying on your back to sitting on the side of a flat bed without using bedrails?: A Little Help needed moving to and from a bed to a chair (including a wheelchair)?: A Little Help needed standing up from a chair using your arms (e.g., wheelchair or  bedside chair)?: A Little Help needed to walk in hospital room?: A Little Help needed climbing 3-5 steps with a railing? : Total 6 Click Score: 16    End of Session Equipment Utilized During Treatment: Gait belt Activity Tolerance: Patient tolerated treatment well Patient left: in bed;with call bell/phone within reach Nurse Communication: Mobility status PT Visit Diagnosis: Unsteadiness on feet (R26.81);Other abnormalities of gait and mobility (R26.89);Muscle weakness (generalized) (M62.81);History of falling (Z91.81);Pain;Adult, failure to thrive (R62.7)     Time: 1335-1400 PT Time Calculation (min) (ACUTE ONLY): 25 min  Charges:  $Gait Training: 8-22 mins $Therapeutic Exercise: 8-22 mins                    Donna Bernard, PT, MPT    Ina Homes 12/22/2021, 2:14 PM

## 2021-12-22 NOTE — TOC Progression Note (Signed)
Transition of Care [facility]) - Progression Note    Patient Details  Name: Kristin Watts MRN: [id number] Date of Birth: 01-12-1956  Transition of Care [facility]) CM/SW Contact  Allayne Butcher, RN Phone Number: 12/22/2021, 3:17 PM  Clinical Narrative:    Kristin Watts offered a bed, bed offer accepted.  RNCM starting insurance auth through [place].     Expected Discharge Plan: Skilled Nursing Facility Barriers to Discharge: SNF Pending bed offer  Expected Discharge Plan and Services Expected Discharge Plan: Skilled Nursing Facility     Post Acute Care Choice: Skilled Nursing Facility Living arrangements for the past 2 months: Group Home, Hotel/Motel                                       Social Determinants of Health (SDOH) Interventions    Readmission Risk Interventions     No data to display

## 2021-12-23 DIAGNOSIS — S0093XA Contusion of unspecified part of head, initial encounter: Secondary | ICD-10-CM | POA: Diagnosis not present

## 2021-12-23 NOTE — TOC Progression Note (Signed)
Transition of Care [facility]) - Progression Note    Patient Details  Name: Kristin Watts MRN: [id number] Date of Birth: May 31, 1955  Transition of Care [facility]) CM/SW Contact  Allayne Butcher, RN Phone Number: 12/23/2021, 12:04 PM  Clinical Narrative:    Patient will be going to room 406 at [facility].  ED RN has called report to [phone].  Safe transport will pick patient up from [facility] ED and transport to facility.     Expected Discharge Plan: Skilled Nursing Facility Barriers to Discharge: SNF Pending bed offer  Expected Discharge Plan and Services Expected Discharge Plan: Skilled Nursing Facility     Post Acute Care Choice: Skilled Nursing Facility Living arrangements for the past 2 months: Group Home, Hotel/Motel                                       Social Determinants of Health (SDOH) Interventions    Readmission Risk Interventions     No data to display

## 2021-12-23 NOTE — ED Notes (Signed)
Patient resting in bed free from sign of distress. Breathing unlabored speaking in full sentences with symmetric chest rise and fall. Bed low and locked with side rails raised x2. Pt in direct view of nurses station.

## 2021-12-23 NOTE — TOC Progression Note (Signed)
Transition of Care [facility]) - Progression Note    Patient Details  Name: Kristin Watts MRN: [id number] Date of Birth: August 08, 1955  Transition of Care [facility]) CM/SW Contact  Allayne Butcher, RN Phone Number: 12/23/2021, 9:45 AM  Clinical Narrative:    Insurance authorization approved auth# [id number], reference [id number] approved 8/17- 8/21.  Message left for Kim over at [facility] to get room number and number to call report.    Expected Discharge Plan: Skilled Nursing Facility Barriers to Discharge: SNF Pending bed offer  Expected Discharge Plan and Services Expected Discharge Plan: Skilled Nursing Facility     Post Acute Care Choice: Skilled Nursing Facility Living arrangements for the past 2 months: Group Home, Hotel/Motel                                       Social Determinants of Health (SDOH) Interventions    Readmission Risk Interventions     No data to display

## 2021-12-23 NOTE — ED Notes (Signed)
Patient ambulatory with walker to restroom. Patient now eating breakfast tray in stretcher at this time. No needs expressed to RN. NAD noted.

## 2021-12-23 NOTE — ED Notes (Signed)
Patient resting comfortably in stretcher at this time. Respirations even and unlabored with no signs of distress.

## 2021-12-23 NOTE — ED Notes (Signed)
Patient resting in bed free from sign of distress. Breathing unlabored speaking in full sentences with symmetric chest rise and fall. Bed low and locked with side rails raised x2. Pt in direct view of nurses station.  

## 2021-12-23 NOTE — ED Notes (Signed)
Attempted to give report. Nurse unavailable at this time. Will pass on to [place], Charity fundraiser.

## 2021-12-23 NOTE — Discharge Instructions (Signed)
Follow-up with your primary doctor this week for checkup.  If you have any new or worsening symptoms call your doctor right away or return to the emergency department.

## 2021-12-23 NOTE — ED Provider Notes (Signed)
-----------------------------------------   3:07 AM on 12/23/2021 -----------------------------------------   Blood pressure 115/62, pulse 68, temperature 98 F (36.7 C), temperature source Oral, resp. rate 17, height 5\' 6"  (1.676 m), weight 73.5 kg, SpO2 98 %.  The patient is calm and cooperative at this time.  There have been no acute events since the last update.  Awaiting disposition plan from case management/social work.    Yesika Rispoli, , DO 12/23/21 [phone]

## 2021-12-23 NOTE — ED Notes (Signed)
Patient resting in bed free from sign of distress. Breathing unlabored with symmetric chest rise and fall. Bed low and locked with side rails raised x2. Pt in direct view of nurses station.

## 2021-12-23 NOTE — ED Provider Notes (Signed)
  Physical Exam  BP 122/80   Pulse 74   Temp 97.8 F (36.6 C) (Oral)   Resp 18   Ht 5\' 6"  (1.676 m)   Wt 73.5 kg   SpO2 95%   BMI 26.15 kg/m   Physical Exam Patient well-appearing with no complaints at this time, resting comfortably, respirations even and unlabored, skin appears warm and well perfused, alert and oriented.  ED Course / MDM     This patient was received in signout in stable condition pending dispo. She has received a bed at outside facility, ready for discharge, no complaints at this time, stable as above.  Medical Decision Making Amount and/or Complexity of Data Reviewed Labs: ordered. Radiology: ordered.  Risk OTC drugs. Prescription drug management.      , MD 12/23/21 [phone]

## 2021-12-23 NOTE — ED Notes (Signed)
Patient resting in bed free from sign of distress. Breathing unlabored speaking in full sentences while awake with noted symmetric chest rise and fall. Bed low and locked with side rails raised x2. Call bell in reach and monitor in place.
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
## 2022-02-14 ENCOUNTER — Encounter: Payer: Self-pay | Admitting: Psychiatry

## 2022-02-14 ENCOUNTER — Ambulatory Visit (INDEPENDENT_AMBULATORY_CARE_PROVIDER_SITE_OTHER): Payer: Medicare HMO | Admitting: Psychiatry

## 2022-02-14 VITALS — BP 102/67 | HR 85 | Temp 97.8°F | Ht 66.0 in | Wt 169.0 lb

## 2022-02-14 DIAGNOSIS — F209 Schizophrenia, unspecified: Secondary | ICD-10-CM

## 2022-02-14 DIAGNOSIS — F4310 Post-traumatic stress disorder, unspecified: Secondary | ICD-10-CM | POA: Diagnosis not present

## 2022-02-14 DIAGNOSIS — F331 Major depressive disorder, recurrent, moderate: Secondary | ICD-10-CM | POA: Diagnosis not present

## 2022-02-14 NOTE — Patient Instructions (Signed)
Continue quetiapine 75 mg at night  Continue venlafaxine 75 mg twice a day Continue mirtazapine 30 mg at night  Continue hydroxyzine 25 mg three time a day  Continue trazodone 50 mg at night  Decrease benztropine 0.5 mg daily

## 2022-03-23 ENCOUNTER — Ambulatory Visit: Payer: Medicare HMO | Admitting: Nurse Practitioner
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
## 2022-04-11 ENCOUNTER — Encounter: Payer: Self-pay | Admitting: Psychiatry

## 2022-04-11 ENCOUNTER — Ambulatory Visit (INDEPENDENT_AMBULATORY_CARE_PROVIDER_SITE_OTHER): Payer: Medicare HMO | Admitting: Psychiatry

## 2022-04-11 VITALS — BP 110/68 | HR 82 | Temp 98.3°F

## 2022-04-11 DIAGNOSIS — F331 Major depressive disorder, recurrent, moderate: Secondary | ICD-10-CM

## 2022-04-11 DIAGNOSIS — F209 Schizophrenia, unspecified: Secondary | ICD-10-CM

## 2022-04-11 DIAGNOSIS — F4310 Post-traumatic stress disorder, unspecified: Secondary | ICD-10-CM | POA: Diagnosis not present

## 2022-04-11 NOTE — Patient Instructions (Signed)
Continue quetiapine 75 mg at night  Continue venlafaxine 75 mg twice a day Continue mirtazapine 30 mg at night  Decrease hydroxyzine 25 mg three time a day as needed for anxiety  Continue trazodone 75 mg at night  Discontinue benztropine  Next appointment: 1/30 at 3 PM
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
## 2022-06-07 ENCOUNTER — Ambulatory Visit: Payer: Medicare HMO | Admitting: Psychiatry

## 2022-08-01 ENCOUNTER — Encounter: Payer: Self-pay | Admitting: Nurse Practitioner

## 2022-08-10 ENCOUNTER — Inpatient Hospital Stay: Payer: Medicare HMO

## 2022-08-10 ENCOUNTER — Inpatient Hospital Stay: Payer: Medicare HMO | Admitting: Oncology

## 2022-08-16 ENCOUNTER — Inpatient Hospital Stay: Payer: Medicare HMO | Attending: Oncology | Admitting: Oncology

## 2022-08-16 ENCOUNTER — Inpatient Hospital Stay: Payer: Medicare HMO

## 2022-08-16 ENCOUNTER — Encounter: Payer: Self-pay | Admitting: Oncology

## 2022-08-16 VITALS — BP 112/69 | HR 73 | Temp 97.5°F | Resp 16 | Ht 65.0 in | Wt 171.0 lb

## 2022-08-16 DIAGNOSIS — D539 Nutritional anemia, unspecified: Secondary | ICD-10-CM

## 2022-08-16 DIAGNOSIS — Z801 Family history of malignant neoplasm of trachea, bronchus and lung: Secondary | ICD-10-CM | POA: Diagnosis not present

## 2022-08-16 DIAGNOSIS — F32A Depression, unspecified: Secondary | ICD-10-CM | POA: Diagnosis not present

## 2022-08-16 DIAGNOSIS — Z79899 Other long term (current) drug therapy: Secondary | ICD-10-CM | POA: Diagnosis not present

## 2022-08-16 DIAGNOSIS — Z7901 Long term (current) use of anticoagulants: Secondary | ICD-10-CM | POA: Diagnosis not present

## 2022-08-16 DIAGNOSIS — F209 Schizophrenia, unspecified: Secondary | ICD-10-CM | POA: Diagnosis not present

## 2022-08-16 DIAGNOSIS — Z87891 Personal history of nicotine dependence: Secondary | ICD-10-CM | POA: Diagnosis not present

## 2022-08-16 DIAGNOSIS — Z86718 Personal history of other venous thrombosis and embolism: Secondary | ICD-10-CM | POA: Diagnosis not present

## 2022-08-16 DIAGNOSIS — D7589 Other specified diseases of blood and blood-forming organs: Secondary | ICD-10-CM | POA: Insufficient documentation

## 2022-08-16 LAB — COMPREHENSIVE METABOLIC PANEL
ALT: 12 U/L (ref 0–44)
AST: 20 U/L (ref 15–41)
Albumin: 3.8 g/dL (ref 3.5–5.0)
Alkaline Phosphatase: 90 U/L (ref 38–126)
Anion gap: 10 (ref 5–15)
BUN: 16 mg/dL (ref 8–23)
CO2: 25 mmol/L (ref 22–32)
Calcium: 9.4 mg/dL (ref 8.9–10.3)
Chloride: 102 mmol/L (ref 98–111)
Creatinine, Ser: 1.3 mg/dL — ABNORMAL HIGH (ref 0.44–1.00)
GFR, Estimated: 46 mL/min — ABNORMAL LOW (ref >60)
Glucose, Bld: 88 mg/dL (ref 70–99)
Potassium: 4.2 mmol/L (ref 3.5–5.1)
Sodium: 137 mmol/L (ref 135–145)
Total Bilirubin: 0.5 mg/dL (ref 0.3–1.2)
Total Protein: 7.5 g/dL (ref 6.5–8.1)

## 2022-08-16 LAB — CBC WITH DIFFERENTIAL/PLATELET
Abs Immature Granulocytes: 0.06 10*3/uL (ref 0.00–0.07)
Basophils Absolute: 0.1 10*3/uL (ref 0.0–0.1)
Basophils Relative: 1 %
Eosinophils Absolute: 0.1 10*3/uL (ref 0.0–0.5)
Eosinophils Relative: 1 %
HCT: 40.4 % (ref 36.0–46.0)
Hemoglobin: 12.8 g/dL (ref 12.0–15.0)
Immature Granulocytes: 1 %
Lymphocytes Relative: 48 %
Lymphs Abs: 5.4 10*3/uL — ABNORMAL HIGH (ref 0.7–4.0)
MCH: 31.6 pg (ref 26.0–34.0)
MCHC: 31.7 g/dL (ref 30.0–36.0)
MCV: 99.8 fL (ref 80.0–100.0)
Monocytes Absolute: 1 10*3/uL (ref 0.1–1.0)
Monocytes Relative: 9 %
Neutro Abs: 4.4 10*3/uL (ref 1.7–7.7)
Neutrophils Relative %: 40 %
Platelets: 401 10*3/uL — ABNORMAL HIGH (ref 150–400)
RBC: 4.05 MIL/uL (ref 3.87–5.11)
RDW: 14 % (ref 11.5–15.5)
WBC: 11.1 10*3/uL — ABNORMAL HIGH (ref 4.0–10.5)
nRBC: 0 % (ref 0.0–0.2)

## 2022-08-16 LAB — VITAMIN B12: Vitamin B-12: 1207 pg/mL — ABNORMAL HIGH (ref 180–914)

## 2022-08-16 LAB — RETICULOCYTES
Immature Retic Fract: 14.3 % (ref 2.3–15.9)
RBC.: 4.05 MIL/uL (ref 3.87–5.11)
Retic Count, Absolute: 52.7 10*3/uL (ref 19.0–186.0)
Retic Ct Pct: 1.3 % (ref 0.4–3.1)

## 2022-08-16 LAB — TSH: TSH: 1.274 u[IU]/mL (ref 0.350–4.500)

## 2022-08-16 LAB — FOLATE: Folate: 13.8 ng/mL (ref >5.9)

## 2022-08-16 LAB — FERRITIN: Ferritin: 65 ng/mL (ref 11–307)

## 2022-08-16 NOTE — Progress Notes (Signed)
Pt referred by Dr Clelia Croft for macrocytic anemia.  Pt resides in a group home in [place], [place].

## 2022-08-16 NOTE — Progress Notes (Signed)
Hematology/Oncology Consult note [facility] Telephone:([phone] [phone]  Patient Care Team: Koren Bound, NP as PCP - General (Nurse Practitioner)   Name of the patient: Kristin Watts  [id number]  [birth year]    Reason for referral-macrocytic anemia   Referring physician-Dr. Sherryll Burger  Date of visit: 08/16/22   History of presenting illness- Patient is a 67 year old female with the past medical history significant for restless leg syndrome, anxiety and depression, history of DVT on Eliquis and schizophrenia who was seen by neurology for cognitive impairment.  She has been referred for macrocytic anemia.  Her most recent labs are only from August 2023 when she was noted to have an H&H of 12.1/39 with an MCV of 100.  Looking back at her prior labs patient has had chronic macrocytosis with an MCV fluctuating between 10 1-108 and hemoglobin that has been mostly between 11-12.  Most recent creatinine from August 2023 was 1.2.  Patient is a resident of long-term nursing facility.  She reports chronic fatigue  ECOG PS- 2  Pain scale- 3   Review of systems- Review of Systems  Constitutional:  Positive for malaise/fatigue. Negative for chills, fever and weight loss.  HENT:  Negative for congestion, ear discharge and nosebleeds.   Eyes:  Negative for blurred vision.  Respiratory:  Negative for cough, hemoptysis, sputum production, shortness of breath and wheezing.   Cardiovascular:  Negative for chest pain, palpitations, orthopnea and claudication.  Gastrointestinal:  Negative for abdominal pain, blood in stool, constipation, diarrhea, heartburn, melena, nausea and vomiting.  Genitourinary:  Negative for dysuria, flank pain, frequency, hematuria and urgency.  Musculoskeletal:  Negative for back pain, joint pain and myalgias.  Skin:  Negative for rash.  Neurological:  Negative for dizziness, tingling, focal weakness, seizures, weakness and headaches.   Endo/Heme/Allergies:  Does not bruise/bleed easily.  Psychiatric/Behavioral:  Negative for depression and suicidal ideas. The patient does not have insomnia.     Allergies  Allergen Reactions   Hydrocodone    Iodinated Contrast Media    Oxycodone    Penicillins    Sulfa Antibiotics    Toradol [Ketorolac Tromethamine]     Patient Active Problem List   Diagnosis Date Noted   Nausea without vomiting    Acute metabolic encephalopathy 10/27/2021   UTI (urinary tract infection) 10/27/2021   Altered mental status 10/26/2021   Dementia without behavioral disturbance 10/26/2021   Schizophrenia 10/26/2021   History of DVT (deep vein thrombosis) 10/26/2021   On continuous oral anticoagulation 10/26/2021   Hyperlipidemia 10/26/2021   Depression 10/26/2021   Restless leg syndrome 10/26/2021   Pyuria 10/26/2021     Past Medical History:  Diagnosis Date   Dementia    Depression    Schizophrenia      Past Surgical History:  Procedure Laterality Date   ESOPHAGOGASTRODUODENOSCOPY (EGD) WITH PROPOFOL N/A 11/01/2021   Procedure: ESOPHAGOGASTRODUODENOSCOPY (EGD) WITH PROPOFOL;  Surgeon: Midge Minium, MD;  Location: [facility] ENDOSCOPY;  Service: Endoscopy;  Laterality: N/A;    Social History   Socioeconomic History   Marital status: Single    Spouse name: Not on file   Number of children: Not on file   Years of education: Not on file   Highest education level: Not on file  Occupational History   Not on file  Tobacco Use   Smoking status: Never   Smokeless tobacco: Never  Substance and Sexual Activity   Alcohol use: Never   Drug use: Never   Sexual activity: Not  Currently  Other Topics Concern   Not on file  Social History Narrative   Not on file   Social Determinants of Health   Financial Resource Strain: Not on file  Food Insecurity: Not on file  Transportation Needs: Not on file  Physical Activity: Not on file  Stress: Not on file  Social Connections: Not on file   Intimate Partner Violence: Not on file     Family History  Family history unknown: Yes     Current Outpatient Medications:    apixaban (ELIQUIS) 5 MG TABS tablet, Take 5 mg by mouth every 12 (twelve) hours., Disp: , Rfl:    atorvastatin (LIPITOR) 20 MG tablet, Take 20 mg by mouth daily., Disp: , Rfl:    benztropine (COGENTIN) 0.5 MG tablet, Take 0.5 mg by mouth 2 (two) times daily., Disp: , Rfl:    cholecalciferol (VITAMIN D) 25 MCG (1000 UNIT) tablet, Take 1,000 Units by mouth daily., Disp: , Rfl:    gabapentin (NEURONTIN) 400 MG capsule, Take 400 mg by mouth 3 (three) times daily., Disp: , Rfl:    GNP MELATONIN 3 MG TABS tablet, Take 3 mg by mouth at bedtime., Disp: , Rfl:    hydrOXYzine (ATARAX) 50 MG tablet, Take 50 mg by mouth 4 (four) times daily., Disp: , Rfl:    mirtazapine (REMERON) 30 MG tablet, Take 30 mg by mouth at bedtime., Disp: , Rfl:    Multiple Vitamin (TAB-A-VITE) TABS, Take 1 tablet by mouth daily., Disp: , Rfl:    pantoprazole (PROTONIX) 40 MG tablet, Take 40 mg by mouth 2 (two) times daily., Disp: , Rfl:    promethazine (PHENERGAN) 25 MG tablet, Take 25 mg by mouth every 8 (eight) hours as needed., Disp: , Rfl:    QUEtiapine (SEROQUEL) 25 MG tablet, Take 75 mg by mouth at bedtime., Disp: , Rfl:    rOPINIRole (REQUIP) 0.5 MG tablet, Take 0.5 mg by mouth at bedtime., Disp: , Rfl:    rosuvastatin (CRESTOR) 10 MG tablet, Take 10 mg by mouth at bedtime., Disp: , Rfl:    traZODone (DESYREL) 50 MG tablet, Take 50 mg by mouth at bedtime., Disp: , Rfl:    venlafaxine (EFFEXOR) 75 MG tablet, Take 75 mg by mouth 2 (two) times daily., Disp: , Rfl:    vitamin B-12 (CYANOCOBALAMIN) 1000 MCG tablet, Take 1,000 mcg by mouth daily., Disp: , Rfl:    Physical exam: There were no vitals filed for this visit. Physical Exam Constitutional:      Comments: Ambulates with a walker.  Appears in no acute distress  Cardiovascular:     Rate and Rhythm: Normal rate and regular rhythm.      Heart sounds: Normal heart sounds.  Pulmonary:     Effort: Pulmonary effort is normal.     Breath sounds: Normal breath sounds.  Abdominal:     General: Bowel sounds are normal.     Palpations: Abdomen is soft.  Skin:    General: Skin is warm and dry.  Neurological:     Mental Status: She is alert and oriented to person, place, and time.           Latest Ref Rng & Units 12/19/2021   12:53 PM  CMP  Glucose 70 - 99 mg/dL 90   BUN 8 - 23 mg/dL 21   Creatinine 0.980.44 - 1.00 mg/dL 1.191.33   Sodium 147135 - 829145 mmol/L 141   Potassium 3.5 - 5.1 mmol/L 4.0   Chloride 98 -  111 mmol/L 108   CO2 22 - 32 mmol/L 26   Calcium 8.9 - 10.3 mg/dL 9.6       Latest Ref Rng & Units 12/19/2021   12:53 PM  CBC  WBC 4.0 - 10.5 K/uL 11.9   Hemoglobin 12.0 - 15.0 g/dL 78.6   Hematocrit 76.7 - 46.0 % 39.0   Platelets 150 - 400 K/uL 383     Assessment and plan- Patient is a 67 y.o. female referred for macrocytic anemia  We do not have any recent blood work for patient at least since August 2023.  Looking back at her prior CBCs she was noted to have chronic macrocytosis and mild anemia with a hemoglobin fluctuating between 11-12.  I will get a complete anemia workup today including CBC ferritin iron studies B12 folate TSH myeloma panel reticulocyte count haptoglobin and serum free light chains along with HIV and copper levels.  She is not on any overt medications which can cause macrocytosis.  I will see her back in 2 weeks time for in person or virtual visit to discuss results of blood work   Thank you for this kind referral and the opportunity to participate in the care of this patient   Visit Diagnosis 1. Macrocytic anemia     Dr. Owens Shark, MD, MPH [facility] at [facility] [phone] 08/16/2022

## 2022-08-17 LAB — HIV ANTIBODY (ROUTINE TESTING W REFLEX): HIV Screen 4th Generation wRfx: NONREACTIVE

## 2022-08-17 LAB — KAPPA/LAMBDA LIGHT CHAINS
Kappa free light chain: 47.2 mg/L — ABNORMAL HIGH (ref 3.3–19.4)
Kappa, lambda light chain ratio: 1.55 (ref 0.26–1.65)
Lambda free light chains: 30.5 mg/L — ABNORMAL HIGH (ref 5.7–26.3)

## 2022-08-17 LAB — HAPTOGLOBIN: Haptoglobin: 253 mg/dL (ref 37–355)

## 2022-08-18 LAB — COPPER, SERUM: Copper: 130 ug/dL (ref 80–158)

## 2022-08-19 LAB — MULTIPLE MYELOMA PANEL, SERUM
Albumin SerPl Elph-Mcnc: 3.7 g/dL (ref 2.9–4.4)
Albumin/Glob SerPl: 1.2 (ref 0.7–1.7)
Alpha 1: 0.2 g/dL (ref 0.0–0.4)
Alpha2 Glob SerPl Elph-Mcnc: 0.9 g/dL (ref 0.4–1.0)
B-Globulin SerPl Elph-Mcnc: 1.2 g/dL (ref 0.7–1.3)
Gamma Glob SerPl Elph-Mcnc: 0.8 g/dL (ref 0.4–1.8)
Globulin, Total: 3.1 g/dL (ref 2.2–3.9)
IgA: 341 mg/dL (ref 87–352)
IgG (Immunoglobin G), Serum: 938 mg/dL (ref 586–1602)
IgM (Immunoglobulin M), Srm: 19 mg/dL — ABNORMAL LOW (ref 26–217)
Total Protein ELP: 6.8 g/dL (ref 6.0–8.5)

## 2022-08-29 ENCOUNTER — Encounter: Payer: Self-pay | Admitting: Oncology

## 2022-08-29 ENCOUNTER — Inpatient Hospital Stay (HOSPITAL_BASED_OUTPATIENT_CLINIC_OR_DEPARTMENT_OTHER): Payer: Medicare HMO | Admitting: Oncology

## 2022-08-29 DIAGNOSIS — D7589 Other specified diseases of blood and blood-forming organs: Secondary | ICD-10-CM | POA: Diagnosis not present

## 2022-08-29 NOTE — Addendum Note (Signed)
Addended by: Corene Cornea on: 08/29/2022 02:52 PM   Modules accepted: Orders

## 2022-08-29 NOTE — Progress Notes (Signed)
I connected with Kristin Watts on 08/29/22 at  3:00 PM EDT by video enabled telemedicine visit and verified that I am speaking with the correct person using two identifiers.   I discussed the limitations, risks, security and privacy concerns of performing an evaluation and management service by telemedicine and the availability of in-person appointments. I also discussed with the patient that there may be a patient responsible charge related to this service. The patient expressed understanding and agreed to proceed.  Other persons participating in the visit and their role in the encounter:  none  Patient's location:  home Provider's location:  work  Stage manager Complaint: Discuss results of blood work  History of present illness: Patient is a 67 year old female with the past medical history significant for restless leg syndrome, anxiety and depression, history of DVT on Eliquis and schizophrenia who was seen by neurology for cognitive impairment.  She has been referred for macrocytic anemia.  Her most recent labs are only from August 2023 when she was noted to have an H&H of 12.1/39 with an MCV of 100.  Looking back at her prior labs patient has had chronic macrocytosis with an MCV fluctuating between 10 1-108 and hemoglobin that has been mostly between 11-12.  Most recent creatinine from August 2023 was 1.2.   Patient is a resident of long-term nursing facility.  She reports chronic fatigue    Interval history no acute issues since last visit.  She has chronic fatigue   Review of Systems  Constitutional:  Positive for malaise/fatigue. Negative for chills, fever and weight loss.  HENT:  Negative for congestion, ear discharge and nosebleeds.   Eyes:  Negative for blurred vision.  Respiratory:  Negative for cough, hemoptysis, sputum production, shortness of breath and wheezing.   Cardiovascular:  Negative for chest pain, palpitations, orthopnea and claudication.  Gastrointestinal:  Negative for  abdominal pain, blood in stool, constipation, diarrhea, heartburn, melena, nausea and vomiting.  Genitourinary:  Negative for dysuria, flank pain, frequency, hematuria and urgency.  Musculoskeletal:  Negative for back pain, joint pain and myalgias.  Skin:  Negative for rash.  Neurological:  Negative for dizziness, tingling, focal weakness, seizures, weakness and headaches.  Endo/Heme/Allergies:  Does not bruise/bleed easily.  Psychiatric/Behavioral:  Negative for depression and suicidal ideas. The patient does not have insomnia.     Allergies  Allergen Reactions   Hydrocodone    Iodinated Contrast Media    Oxycodone    Penicillins    Sulfa Antibiotics    Toradol [Ketorolac Tromethamine]     Past Medical History:  Diagnosis Date   Dementia    Depression    Schizophrenia     Past Surgical History:  Procedure Laterality Date   APPENDECTOMY     ESOPHAGOGASTRODUODENOSCOPY (EGD) WITH PROPOFOL N/A 11/01/2021   Procedure: ESOPHAGOGASTRODUODENOSCOPY (EGD) WITH PROPOFOL;  Surgeon: Midge Minium, MD;  Location: [facility] ENDOSCOPY;  Service: Endoscopy;  Laterality: N/A;   HERNIA REPAIR     SPLENECTOMY     TOTAL HIP ARTHROPLASTY Right    TUBAL LIGATION      Social History   Socioeconomic History   Marital status: Single    Spouse name: Not on file   Number of children: Not on file   Years of education: Not on file   Highest education level: Not on file  Occupational History   Not on file  Tobacco Use   Smoking status: Former    Packs/day: .25    Types: Cigarettes   Smokeless tobacco: Never  Vaping Use   Vaping Use: Never used  Substance and Sexual Activity   Alcohol use: Not Currently   Drug use: Never   Sexual activity: Not Currently  Other Topics Concern   Not on file  Social History Narrative   Not on file   Social Determinants of Health   Financial Resource Strain: Not on file  Food Insecurity: No Food Insecurity (08/16/2022)   Hunger Vital Sign    Worried About  Running Out of Food in the Last Year: Never true    Ran Out of Food in the Last Year: Never true  Transportation Needs: No Transportation Needs (08/16/2022)   PRAPARE - Administrator, Civil Service (Medical): No    Lack of Transportation (Non-Medical): No  Physical Activity: Not on file  Stress: Not on file  Social Connections: Not on file  Intimate Partner Violence: Not At Risk (08/16/2022)   Humiliation, Afraid, Rape, and Kick questionnaire    Fear of Current or Ex-Partner: No    Emotionally Abused: No    Physically Abused: No    Sexually Abused: No    Family History  Problem Relation Age of Onset   Lung cancer Maternal Aunt    Lung cancer Cousin      Current Outpatient Medications:    acetaminophen (TYLENOL) 650 MG CR tablet, Take by mouth., Disp: , Rfl:    ammonium lactate (AMLACTIN) 12 % cream, Apply 1 Application topically 2 (two) times daily., Disp: , Rfl:    cholecalciferol (VITAMIN D) 25 MCG (1000 UNIT) tablet, Take 1,000 Units by mouth daily., Disp: , Rfl:    desonide (DESOWEN) 0.05 % cream, Apply topically., Disp: , Rfl:    divalproex (DEPAKOTE) 125 MG DR tablet, Take by mouth., Disp: , Rfl:    gabapentin (NEURONTIN) 100 MG capsule, Take 100 mg by mouth daily., Disp: , Rfl:    gabapentin (NEURONTIN) 300 MG capsule, Take 300 mg by mouth daily., Disp: , Rfl:    gabapentin (NEURONTIN) 400 MG capsule, Take 400 mg by mouth 3 (three) times daily., Disp: , Rfl:    GNP MELATONIN 3 MG TABS tablet, Take 3 mg by mouth at bedtime., Disp: , Rfl:    hydrOXYzine (ATARAX) 25 MG tablet, Take 25 mg by mouth 3 (three) times daily., Disp: , Rfl:    hydrOXYzine (ATARAX) 50 MG tablet, Take 50 mg by mouth 4 (four) times daily., Disp: , Rfl:    ketoconazole (NIZORAL) 2 % cream, SMARTSIG:1 Application Topical 1 to 2 Times Daily, Disp: , Rfl:    LAGEVRIO 200 MG CAPS capsule, Take by mouth., Disp: , Rfl:    LORazepam (ATIVAN) 0.5 MG tablet, Take by mouth., Disp: , Rfl:    mirtazapine  (REMERON) 15 MG tablet, Take 15 mg by mouth at bedtime., Disp: , Rfl:    mirtazapine (REMERON) 30 MG tablet, Take 30 mg by mouth at bedtime., Disp: , Rfl:    Multiple Vitamin (TAB-A-VITE) TABS, Take 1 tablet by mouth daily., Disp: , Rfl:    pantoprazole (PROTONIX) 40 MG tablet, Take 40 mg by mouth 2 (two) times daily., Disp: , Rfl:    promethazine (PHENERGAN) 25 MG tablet, Take 25 mg by mouth every 8 (eight) hours as needed., Disp: , Rfl:    QUEtiapine (SEROQUEL) 25 MG tablet, Take 75 mg by mouth at bedtime., Disp: , Rfl:    rOPINIRole (REQUIP) 0.5 MG tablet, Take 0.5 mg by mouth at bedtime., Disp: , Rfl:    traZODone (  DESYREL) 100 MG tablet, Take 100 mg by mouth at bedtime., Disp: , Rfl:    traZODone (DESYREL) 50 MG tablet, Take 50 mg by mouth at bedtime., Disp: , Rfl:    triamcinolone (KENALOG) 0.025 % ointment, Apply topically., Disp: , Rfl:    venlafaxine (EFFEXOR) 37.5 MG tablet, Take 37.5 mg by mouth daily., Disp: , Rfl:    venlafaxine (EFFEXOR) 75 MG tablet, Take 75 mg by mouth 2 (two) times daily., Disp: , Rfl:    venlafaxine (EFFEXOR) 75 MG tablet, Take by mouth., Disp: , Rfl:    venlafaxine XR (EFFEXOR-XR) 37.5 MG 24 hr capsule, Take by mouth., Disp: , Rfl:    apixaban (ELIQUIS) 5 MG TABS tablet, Take 5 mg by mouth every 12 (twelve) hours. (Patient not taking: Reported on 08/29/2022), Disp: , Rfl:    atorvastatin (LIPITOR) 20 MG tablet, Take 20 mg by mouth daily. (Patient not taking: Reported on 08/29/2022), Disp: , Rfl:    benztropine (COGENTIN) 0.5 MG tablet, Take 0.5 mg by mouth 2 (two) times daily. (Patient not taking: Reported on 08/29/2022), Disp: , Rfl:    Carboxymethylcellulose Sodium 1 % GEL, Apply to eye. (Patient not taking: Reported on 08/29/2022), Disp: , Rfl:    rosuvastatin (CRESTOR) 10 MG tablet, Take 10 mg by mouth at bedtime. (Patient not taking: Reported on 08/29/2022), Disp: , Rfl:   No results found.  No images are attached to the encounter.      Latest Ref Rng &  Units 08/16/2022    3:42 PM  CMP  Glucose 70 - 99 mg/dL 88   BUN 8 - 23 mg/dL 16   Creatinine 1.61 - 1.00 mg/dL 0.96   Sodium 045 - 409 mmol/L 137   Potassium 3.5 - 5.1 mmol/L 4.2   Chloride 98 - 111 mmol/L 102   CO2 22 - 32 mmol/L 25   Calcium 8.9 - 10.3 mg/dL 9.4   Total Protein 6.5 - 8.1 g/dL 7.5   Total Bilirubin 0.3 - 1.2 mg/dL 0.5   Alkaline Phos 38 - 126 U/L 90   AST 15 - 41 U/L 20   ALT 0 - 44 U/L 12       Latest Ref Rng & Units 08/16/2022    3:42 PM  CBC  WBC 4.0 - 10.5 K/uL 11.1   Hemoglobin 12.0 - 15.0 g/dL 81.1   Hematocrit 91.4 - 46.0 % 40.4   Platelets 150 - 400 K/uL 401      Observation/objective: Appears in no acute distress over video visit today.  Breathing is nonlabored  Assessment and plan: Patient is a 67 year old female referred for macrocytosis  Patient has evidence of macrocytosis without overt anemia.  Hemoglobin has remained stable around 12.  MCV fluctuates between 99-1 03.  Iron studies, B12, folate, TSH unremarkable.  Myeloma panel showed no M protein.  Both kappa and lambda light chains were mildly elevated with a normal free light chain ratio.  Suspect salute elevation in kappa and lambda light chain likely secondary to chronic kidney disease.  Patient seems to have chronic elevation in her creatinine which fluctuates between 1.3-1.6.  I do not suspect the patient has any underlying plasma cell disorder at this time  Follow-up instructions: CBC with differential in 6 months in 1 year and I will see her back in 1 year  I discussed the assessment and treatment plan with the patient. The patient was provided an opportunity to ask questions and all were answered. The patient agreed with  the plan and demonstrated an understanding of the instructions.   The patient was advised to call back or seek an in-person evaluation if the symptoms worsen or if the condition fails to improve as anticipated.  I provided 11 minutes of face-to-face video visit time  during this encounter.  Time spent in reviewing patient's blood work and formulating future care plan.  Visit Diagnosis: 1. Macrocytosis     Dr. Owens Shark, MD, MPH [facility] at [facility] Tel- [phone] 08/29/2022 2:28 PM

## 2022-08-30 ENCOUNTER — Ambulatory Visit: Payer: Medicare HMO | Admitting: Oncology

## 2022-09-01 ENCOUNTER — Telehealth: Payer: Self-pay | Admitting: Oncology

## 2022-09-01 ENCOUNTER — Telehealth: Payer: Self-pay | Admitting: *Deleted

## 2022-09-01 NOTE — Telephone Encounter (Signed)
Spoke with Victorino Dike at [facility]. She requested we cancel the MD appointment on 09/09/22 stating that it was created in error and per last visit with Dr. Smith Robert, patient is not due to return to clinic until April 2025. I have cancelled this appointment.   Facility rep also stated that they are able to draw labs and fax them over when she is due for her 6 month lab only check in OCT. Provided her with our fax #, Labs needed (CBC w diff) and approx date needed. [facility] has been cancelled.   Routing to team.

## 2022-09-01 NOTE — Telephone Encounter (Signed)
Patient wanted to get labs done [place] for her. Victorino Dike spoke to the office in [place] and they will do cbc/d in October 22 and fax it to [place] with results and saves pt. From having to come here . Victorino Dike gave staff date 02/28/2023 for lab and fax # to send [place] results

## 2022-09-09 ENCOUNTER — Ambulatory Visit: Payer: Medicare HMO | Admitting: Oncology

## 2022-10-04 ENCOUNTER — Encounter: Payer: Self-pay | Admitting: Oncology

## 2022-10-05 NOTE — Telephone Encounter (Signed)
Repeat cbc with diff in 2 months and we will go from there

## 2022-10-11 ENCOUNTER — Other Ambulatory Visit: Payer: Self-pay | Admitting: Physician Assistant

## 2022-10-11 DIAGNOSIS — R42 Dizziness and giddiness: Secondary | ICD-10-CM

## 2022-10-11 DIAGNOSIS — R519 Headache, unspecified: Secondary | ICD-10-CM

## 2023-02-28 ENCOUNTER — Other Ambulatory Visit: Payer: Medicare HMO

## 2023-08-29 ENCOUNTER — Telehealth: Payer: Self-pay

## 2023-08-29 ENCOUNTER — Inpatient Hospital Stay: Payer: Medicare HMO | Admitting: Oncology

## 2023-08-29 ENCOUNTER — Telehealth: Payer: Self-pay | Admitting: Oncology

## 2023-08-29 ENCOUNTER — Inpatient Hospital Stay: Payer: Medicare HMO | Attending: Oncology

## 2023-08-29 NOTE — Telephone Encounter (Signed)
 Tried calling all numbers in patients chart, none of them are in service. Called [place] [facility] where the patient was living, they said that she hasn't lived there for a long time and they do not know where sheis. I then called the number in care everywhere from her recent [facility] visit and it went straight to voicemail and the mailbox was full

## 2023-08-29 NOTE — Telephone Encounter (Signed)
 08/29/23 at 4:12PM - Per message received from Lesa Rape "Just wanted to let you know, I tried calling all numbers on her chart and even the alternate contact. None of them were in service. Then I called the [place] [facility] that she was at and they said "she isnt living there and hasn't been for a long time and they dont know where she is" so then I called the number in care everywhere from her latest appt at [facility] and that number went straight to voicmail and the mailbox was full. I am not sure how to get in touch ". Per Dr. Randy Buttery "She can just follow up with pcp. No follow up neeeded. Inform pcp please".  Outbound call to PCP Dr. Tobi Fortes [phone]; recording stated the call cannot be completed at this time and to try the call again later.  Will try calling again tomorrow.

## 2023-08-30 NOTE — Telephone Encounter (Signed)
 Outbound call again to PCP Dr. Tobi Fortes [phone]; recording stated the call cannot be completed at this time and to try the call again later.

## 2023-08-31 NOTE — Telephone Encounter (Signed)
 Outbound call again to PCP Dr. Tobi Fortes [phone]; phone continued to ring unable to leave a voice message.  Unable to locate other PCP information in chart.

## 2024-01-03 IMAGING — US US EXTREM LOW VENOUS
1 series · 13 of 24 positions shown · non-contrast
Comparison: None Available.

CLINICAL DATA: Bilateral lower extremity edema.



[Series 1: us venous img lower bilat (dvt) · portal-venous · 13 of 58 slices shown]
[im 1/58]
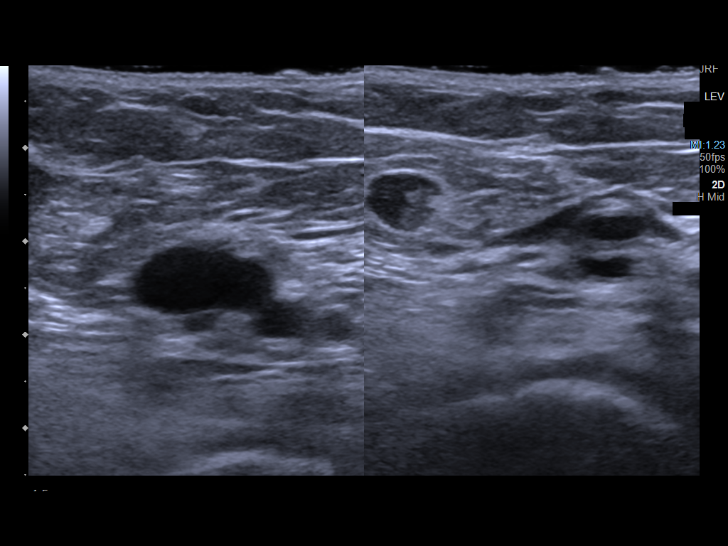
[im 5/58]
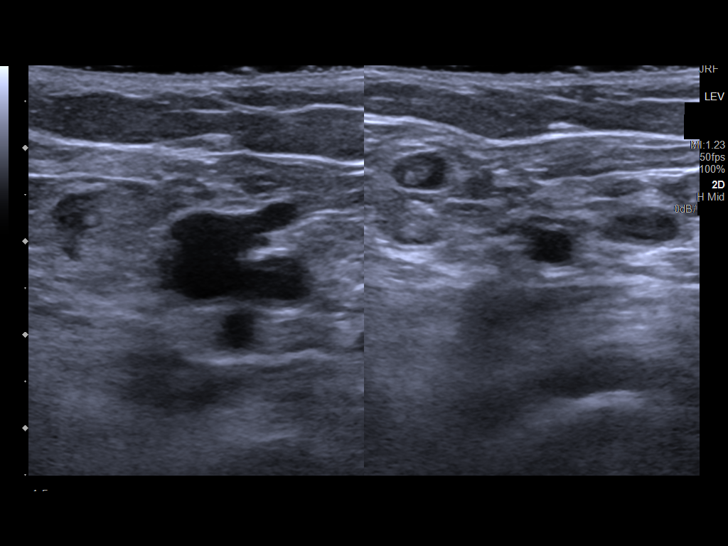
[im 10/58]
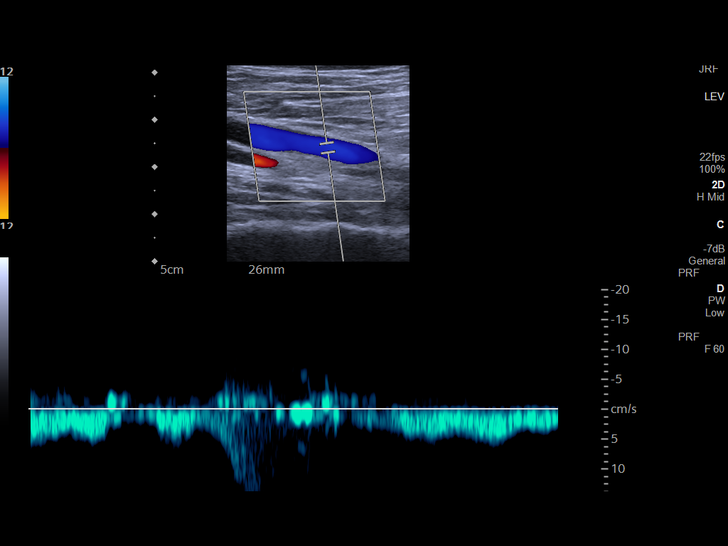
[im 15/58]
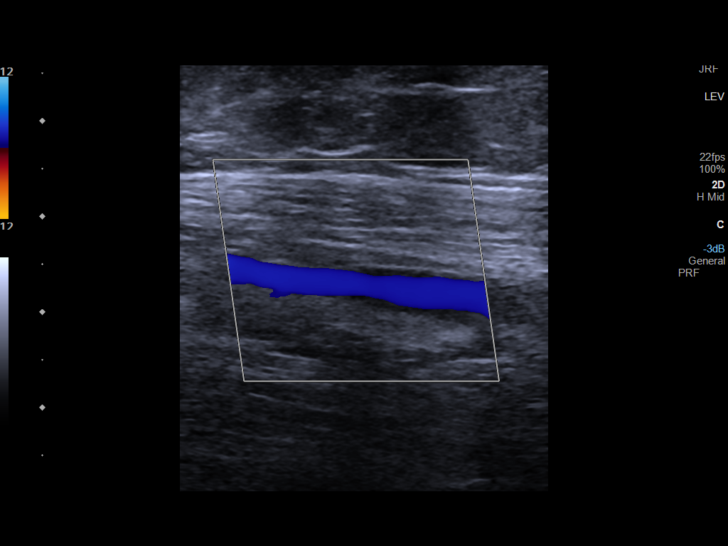
[im 20/58]
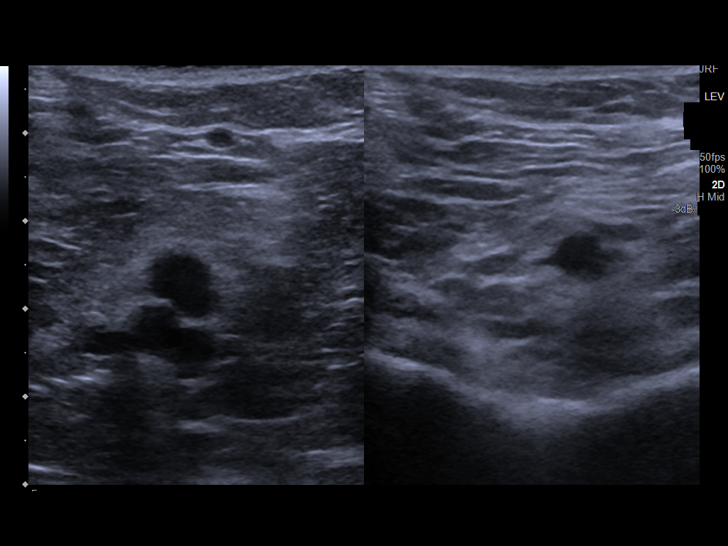
[im 25/58]
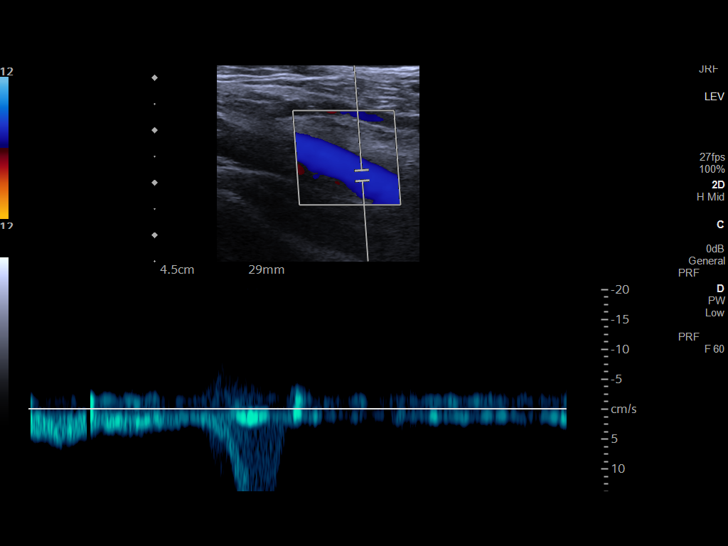
[im 30/58]
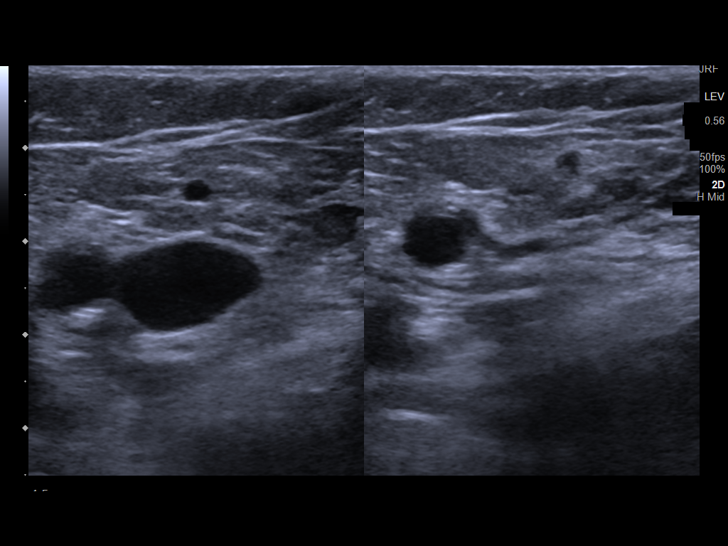
[im 33/58]
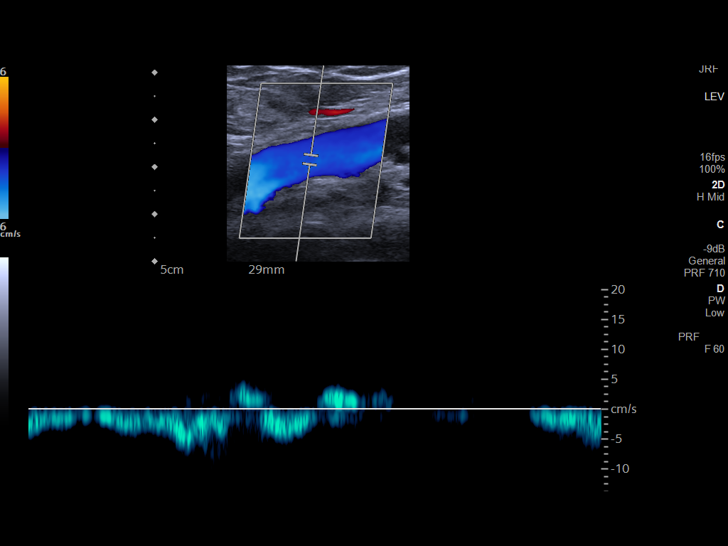
[im 38/58]
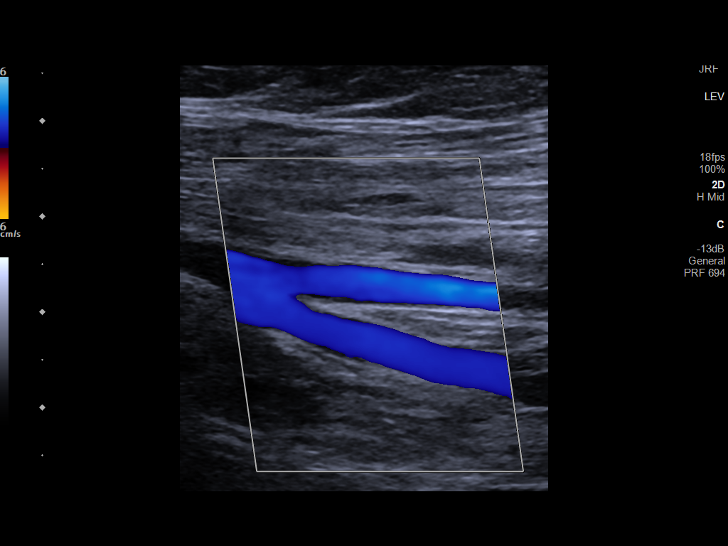
[im 43/58]
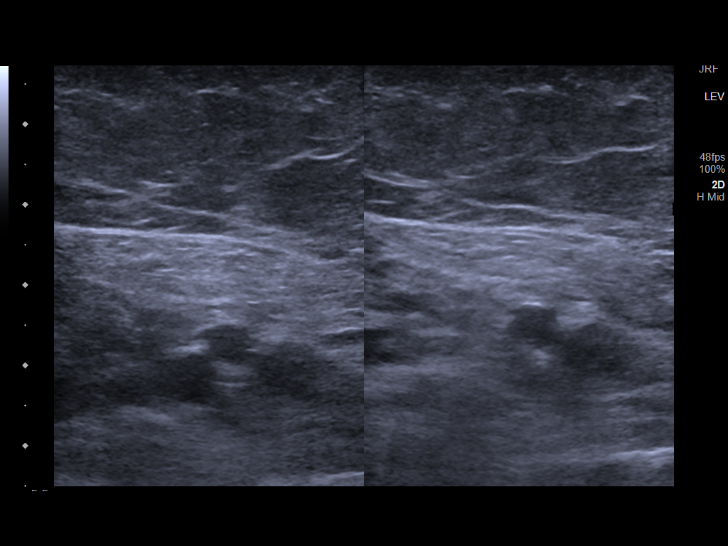
[im 48/58]
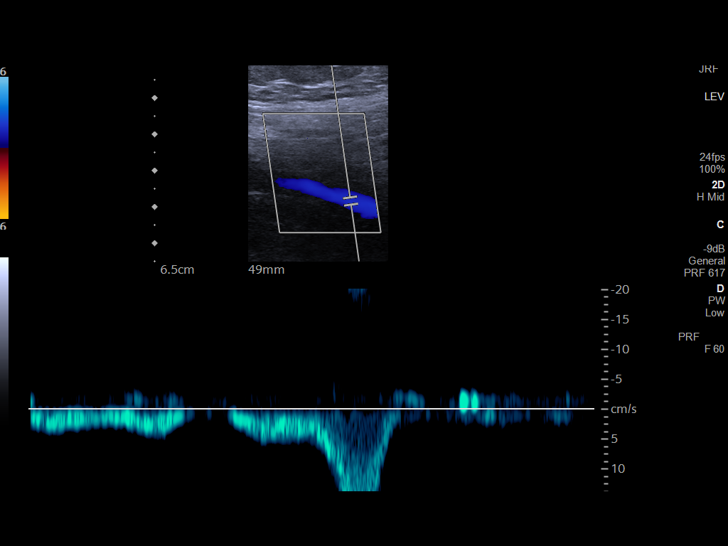
[im 53/58]
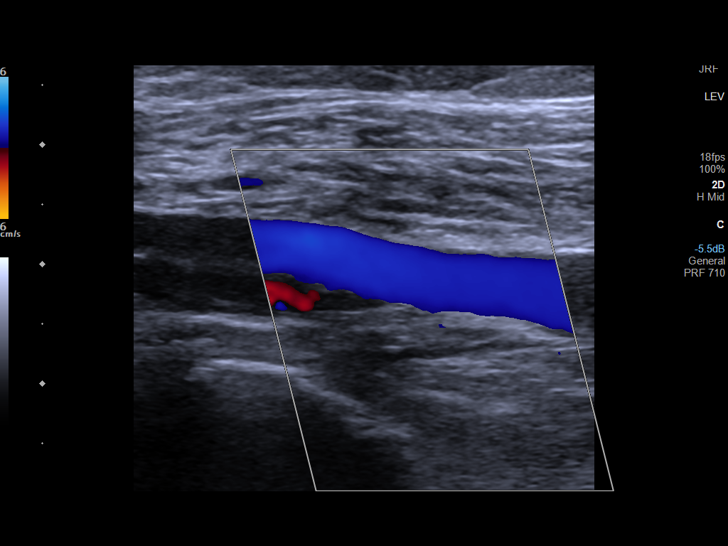
[im 58/58]
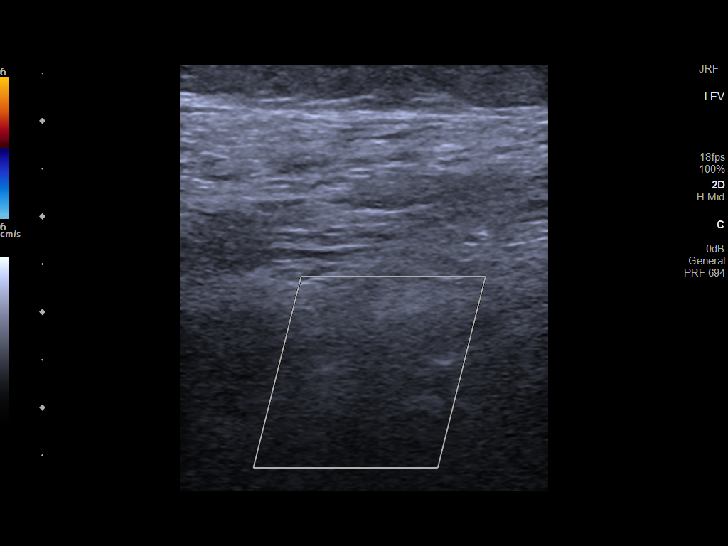

[13 of 24 positions shown; findings below may reference images not displayed]

FINDINGS: RIGHT LOWER EXTREMITY

Common Femoral Vein: No evidence of thrombus. Normal
compressibility, respiratory phasicity and response to augmentation.

Saphenofemoral Junction: No evidence of thrombus. Normal
compressibility and flow on color Doppler imaging.

Profunda Femoral Vein: No evidence of thrombus. Normal
compressibility and flow on color Doppler imaging.

Femoral Vein: No evidence of thrombus. Normal compressibility,
respiratory phasicity and response to augmentation.

Popliteal Vein: No evidence of thrombus. Normal compressibility,
respiratory phasicity and response to augmentation.

Calf Veins: No evidence of thrombus. Normal compressibility and flow
on color Doppler imaging.

Venous Reflux:  None.

Other Findings:  None.

LEFT LOWER EXTREMITY

Common Femoral Vein: No evidence of thrombus. Normal
compressibility, respiratory phasicity and response to augmentation.

Saphenofemoral Junction: No evidence of thrombus. Normal
compressibility and flow on color Doppler imaging.

Profunda Femoral Vein: No evidence of thrombus. Normal
compressibility and flow on color Doppler imaging.

Femoral Vein: No evidence of thrombus. Normal compressibility,
respiratory phasicity and response to augmentation.

Popliteal Vein: No evidence of thrombus. Normal compressibility,
respiratory phasicity and response to augmentation.

Calf Veins: No evidence of thrombus. Normal compressibility and flow
on color Doppler imaging.

Venous Reflux:  None.

Other Findings:  None.
IMPRESSION: No evidence of deep venous thrombosis in either lower extremity.
